# Patient Record
Sex: Female | Born: 1950 | Race: White | Hispanic: No | Smoking: Former smoker
Health system: Southern US, Community
[De-identification: ages and names within clinical notes are randomized; demographics above are authoritative.]

## PROBLEM LIST (undated history)

## (undated) DIAGNOSIS — R011 Cardiac murmur, unspecified: Secondary | ICD-10-CM

## (undated) DIAGNOSIS — I10 Essential (primary) hypertension: Secondary | ICD-10-CM

## (undated) DIAGNOSIS — M1990 Unspecified osteoarthritis, unspecified site: Secondary | ICD-10-CM

## (undated) DIAGNOSIS — C801 Malignant (primary) neoplasm, unspecified: Secondary | ICD-10-CM

## (undated) DIAGNOSIS — I8390 Asymptomatic varicose veins of unspecified lower extremity: Secondary | ICD-10-CM

## (undated) DIAGNOSIS — I499 Cardiac arrhythmia, unspecified: Secondary | ICD-10-CM

## (undated) DIAGNOSIS — N200 Calculus of kidney: Secondary | ICD-10-CM

## (undated) DIAGNOSIS — I2510 Atherosclerotic heart disease of native coronary artery without angina pectoris: Secondary | ICD-10-CM

## (undated) DIAGNOSIS — I739 Peripheral vascular disease, unspecified: Secondary | ICD-10-CM

## (undated) HISTORY — DX: Asymptomatic varicose veins of unspecified lower extremity: I83.90

## (undated) HISTORY — PX: EYE SURGERY: SHX253

## (undated) HISTORY — PX: CHOLECYSTECTOMY: SHX55

## (undated) HISTORY — PX: PELVIC EXENTERATION: SHX738

## (undated) HISTORY — PX: ABDOMINAL HYSTERECTOMY: SHX81

## (undated) HISTORY — PX: CATARACT EXTRACTION, BILATERAL: SHX1313

---

## 2001-03-07 ENCOUNTER — Emergency Department (HOSPITAL_COMMUNITY): Admission: EM | Admit: 2001-03-07 | Discharge: 2001-03-07 | Payer: Self-pay | Admitting: *Deleted

## 2003-05-17 ENCOUNTER — Emergency Department (HOSPITAL_COMMUNITY): Admission: EM | Admit: 2003-05-17 | Discharge: 2003-05-17 | Payer: Self-pay

## 2005-05-20 ENCOUNTER — Other Ambulatory Visit: Admission: RE | Admit: 2005-05-20 | Discharge: 2005-05-20 | Payer: Self-pay | Admitting: Family Medicine

## 2005-09-17 ENCOUNTER — Ambulatory Visit (HOSPITAL_COMMUNITY): Admission: RE | Admit: 2005-09-17 | Discharge: 2005-09-17 | Payer: Self-pay | Admitting: Family Medicine

## 2005-10-02 ENCOUNTER — Encounter: Admission: RE | Admit: 2005-10-02 | Discharge: 2005-10-02 | Payer: Self-pay | Admitting: Family Medicine

## 2005-10-02 ENCOUNTER — Encounter (INDEPENDENT_AMBULATORY_CARE_PROVIDER_SITE_OTHER): Payer: Self-pay | Admitting: Diagnostic Radiology

## 2005-12-26 ENCOUNTER — Emergency Department (HOSPITAL_COMMUNITY): Admission: EM | Admit: 2005-12-26 | Discharge: 2005-12-26 | Payer: Self-pay | Admitting: Emergency Medicine

## 2006-02-11 ENCOUNTER — Other Ambulatory Visit: Admission: RE | Admit: 2006-02-11 | Discharge: 2006-02-11 | Payer: Self-pay | Admitting: Obstetrics and Gynecology

## 2006-04-21 ENCOUNTER — Emergency Department (HOSPITAL_COMMUNITY): Admission: EM | Admit: 2006-04-21 | Discharge: 2006-04-21 | Payer: Self-pay | Admitting: Emergency Medicine

## 2006-04-30 ENCOUNTER — Ambulatory Visit (HOSPITAL_COMMUNITY): Admission: RE | Admit: 2006-04-30 | Discharge: 2006-04-30 | Payer: Self-pay | Admitting: Family Medicine

## 2006-05-28 ENCOUNTER — Ambulatory Visit: Payer: Self-pay | Admitting: Oncology

## 2006-06-08 ENCOUNTER — Ambulatory Visit: Admission: RE | Admit: 2006-06-08 | Discharge: 2006-09-12 | Payer: Self-pay | Admitting: Radiation Oncology

## 2006-06-08 LAB — COMPREHENSIVE METABOLIC PANEL
Albumin: 4.5 g/dL (ref 3.5–5.2)
Alkaline Phosphatase: 95 U/L (ref 39–117)
BUN: 10 mg/dL (ref 6–23)
CO2: 24 mEq/L (ref 19–32)
Calcium: 9.6 mg/dL (ref 8.4–10.5)
Glucose, Bld: 82 mg/dL (ref 70–99)
Potassium: 3.9 mEq/L (ref 3.5–5.3)
Sodium: 140 mEq/L (ref 135–145)
Total Protein: 7.2 g/dL (ref 6.0–8.3)

## 2006-06-08 LAB — CBC WITH DIFFERENTIAL/PLATELET
Basophils Absolute: 0 10*3/uL (ref 0.0–0.1)
Eosinophils Absolute: 0.2 10*3/uL (ref 0.0–0.5)
HGB: 11.4 g/dL — ABNORMAL LOW (ref 11.6–15.9)
MCV: 82.7 fL (ref 81.0–101.0)
MONO#: 0.4 10*3/uL (ref 0.1–0.9)
MONO%: 7.7 % (ref 0.0–13.0)
NEUT#: 3.2 10*3/uL (ref 1.5–6.5)
Platelets: 321 10*3/uL (ref 145–400)
RDW: 14 % (ref 11.3–14.5)
WBC: 5.8 10*3/uL (ref 3.9–10.0)

## 2006-06-09 LAB — URINALYSIS, MICROSCOPIC - CHCC
Bilirubin (Urine): NEGATIVE
Glucose: NEGATIVE g/dL
Ketones: NEGATIVE mg/dL
Leukocyte Esterase: NEGATIVE
RBC count: NEGATIVE (ref 0–2)
WBC, UA: NEGATIVE (ref 0–2)
pH: 7.5 (ref 4.6–8.0)

## 2006-06-15 LAB — URINE CULTURE

## 2006-06-16 LAB — URINALYSIS, MICROSCOPIC - CHCC
Glucose: NEGATIVE g/dL
Leukocyte Esterase: NEGATIVE
Nitrite: NEGATIVE
Protein: NEGATIVE mg/dL
RBC count: NEGATIVE (ref 0–2)
Specific Gravity, Urine: 1.01 (ref 1.003–1.035)
WBC, UA: NEGATIVE (ref 0–2)

## 2006-06-18 LAB — URINE CULTURE

## 2006-06-25 ENCOUNTER — Ambulatory Visit: Payer: Self-pay | Admitting: Vascular Surgery

## 2006-06-25 ENCOUNTER — Encounter: Payer: Self-pay | Admitting: Vascular Surgery

## 2006-06-25 ENCOUNTER — Ambulatory Visit: Admission: RE | Admit: 2006-06-25 | Discharge: 2006-06-25 | Payer: Self-pay | Admitting: Oncology

## 2006-06-25 LAB — CBC WITH DIFFERENTIAL/PLATELET
BASO%: 0.5 % (ref 0.0–2.0)
Basophils Absolute: 0 10*3/uL (ref 0.0–0.1)
EOS%: 1.8 % (ref 0.0–7.0)
HGB: 11.6 g/dL (ref 11.6–15.9)
MCH: 28.1 pg (ref 26.0–34.0)
MCHC: 34 g/dL (ref 32.0–36.0)
MCV: 82.6 fL (ref 81.0–101.0)
MONO%: 6.8 % (ref 0.0–13.0)
RDW: 13 % (ref 11.3–14.5)
lymph#: 1.7 10*3/uL (ref 0.9–3.3)

## 2006-06-25 LAB — BASIC METABOLIC PANEL
BUN: 11 mg/dL (ref 6–23)
Creatinine, Ser: 0.71 mg/dL (ref 0.40–1.20)
Potassium: 4.1 mEq/L (ref 3.5–5.3)

## 2006-06-25 LAB — MAGNESIUM: Magnesium: 1.8 mg/dL (ref 1.5–2.5)

## 2006-06-30 ENCOUNTER — Ambulatory Visit (HOSPITAL_COMMUNITY): Admission: RE | Admit: 2006-06-30 | Discharge: 2006-06-30 | Payer: Self-pay | Admitting: Oncology

## 2006-07-02 LAB — COMPREHENSIVE METABOLIC PANEL
ALT: 17 U/L (ref 0–35)
Albumin: 4.5 g/dL (ref 3.5–5.2)
CO2: 26 mEq/L (ref 19–32)
Calcium: 9.2 mg/dL (ref 8.4–10.5)
Chloride: 104 mEq/L (ref 96–112)
Potassium: 4.2 mEq/L (ref 3.5–5.3)
Sodium: 139 mEq/L (ref 135–145)
Total Protein: 6.8 g/dL (ref 6.0–8.3)

## 2006-07-02 LAB — CBC WITH DIFFERENTIAL/PLATELET
BASO%: 1.2 % (ref 0.0–2.0)
Eosinophils Absolute: 0.2 10*3/uL (ref 0.0–0.5)
MCHC: 34.8 g/dL (ref 32.0–36.0)
MONO#: 0.3 10*3/uL (ref 0.1–0.9)
NEUT#: 2.7 10*3/uL (ref 1.5–6.5)
RBC: 3.95 10*6/uL (ref 3.70–5.32)
WBC: 4.1 10*3/uL (ref 3.9–10.0)
lymph#: 0.8 10*3/uL — ABNORMAL LOW (ref 0.9–3.3)

## 2006-07-02 LAB — MAGNESIUM: Magnesium: 2.1 mg/dL (ref 1.5–2.5)

## 2006-07-06 LAB — CBC WITH DIFFERENTIAL/PLATELET
Eosinophils Absolute: 0.2 10*3/uL (ref 0.0–0.5)
MCV: 81.9 fL (ref 81.0–101.0)
MONO%: 7.4 % (ref 0.0–13.0)
NEUT#: 3.3 10*3/uL (ref 1.5–6.5)
RBC: 4.21 10*6/uL (ref 3.70–5.32)
RDW: 11.3 % (ref 11.3–14.5)
WBC: 4.6 10*3/uL (ref 3.9–10.0)

## 2006-07-06 LAB — MAGNESIUM: Magnesium: 2.4 mg/dL (ref 1.5–2.5)

## 2006-07-06 LAB — BASIC METABOLIC PANEL
CO2: 29 mEq/L (ref 19–32)
Calcium: 9.4 mg/dL (ref 8.4–10.5)
Creatinine, Ser: 0.65 mg/dL (ref 0.40–1.20)

## 2006-07-07 ENCOUNTER — Ambulatory Visit: Payer: Self-pay | Admitting: Oncology

## 2006-07-09 LAB — COMPREHENSIVE METABOLIC PANEL
AST: 15 U/L (ref 0–37)
Albumin: 4.2 g/dL (ref 3.5–5.2)
Alkaline Phosphatase: 49 U/L (ref 39–117)
Glucose, Bld: 63 mg/dL — ABNORMAL LOW (ref 70–99)
Potassium: 3.8 mEq/L (ref 3.5–5.3)
Sodium: 142 mEq/L (ref 135–145)
Total Protein: 6.7 g/dL (ref 6.0–8.3)

## 2006-07-13 LAB — CBC WITH DIFFERENTIAL/PLATELET
BASO%: 1.1 % (ref 0.0–2.0)
LYMPH%: 14.5 % (ref 14.0–48.0)
MCH: 28 pg (ref 26.0–34.0)
MCHC: 34.3 g/dL (ref 32.0–36.0)
MCV: 81.6 fL (ref 81.0–101.0)
MONO%: 7.5 % (ref 0.0–13.0)
Platelets: 261 10*3/uL (ref 145–400)
RBC: 4.13 10*6/uL (ref 3.70–5.32)
WBC: 4.2 10*3/uL (ref 3.9–10.0)

## 2006-07-20 LAB — URINALYSIS, MICROSCOPIC - CHCC
Bilirubin (Urine): NEGATIVE
Glucose: NEGATIVE g/dL
Ketones: NEGATIVE mg/dL
RBC count: NEGATIVE (ref 0–2)
pH: 6 (ref 4.6–8.0)

## 2006-07-20 LAB — CBC WITH DIFFERENTIAL/PLATELET
Basophils Absolute: 0.1 10*3/uL (ref 0.0–0.1)
EOS%: 3.6 % (ref 0.0–7.0)
Eosinophils Absolute: 0.1 10*3/uL (ref 0.0–0.5)
HGB: 11.3 g/dL — ABNORMAL LOW (ref 11.6–15.9)
MCH: 28.2 pg (ref 26.0–34.0)
NEUT#: 2.4 10*3/uL (ref 1.5–6.5)
RDW: 12.6 % (ref 11.3–14.5)
lymph#: 0.5 10*3/uL — ABNORMAL LOW (ref 0.9–3.3)

## 2006-07-20 LAB — BASIC METABOLIC PANEL
Calcium: 9.3 mg/dL (ref 8.4–10.5)
Sodium: 139 mEq/L (ref 135–145)

## 2006-07-20 LAB — MAGNESIUM: Magnesium: 2.3 mg/dL (ref 1.5–2.5)

## 2006-07-21 LAB — URINE CULTURE

## 2006-07-27 LAB — CBC WITH DIFFERENTIAL/PLATELET
Basophils Absolute: 0 10*3/uL (ref 0.0–0.1)
Eosinophils Absolute: 0.1 10*3/uL (ref 0.0–0.5)
HCT: 32.3 % — ABNORMAL LOW (ref 34.8–46.6)
HGB: 11 g/dL — ABNORMAL LOW (ref 11.6–15.9)
MONO#: 0.4 10*3/uL (ref 0.1–0.9)
NEUT%: 76.6 % (ref 39.6–76.8)
Platelets: 231 10*3/uL (ref 145–400)
WBC: 4.2 10*3/uL (ref 3.9–10.0)
lymph#: 0.4 10*3/uL — ABNORMAL LOW (ref 0.9–3.3)

## 2006-08-21 ENCOUNTER — Ambulatory Visit: Payer: Self-pay | Admitting: Oncology

## 2006-08-25 LAB — COMPREHENSIVE METABOLIC PANEL
AST: 18 U/L (ref 0–37)
BUN: 11 mg/dL (ref 6–23)
Calcium: 9.5 mg/dL (ref 8.4–10.5)
Chloride: 105 mEq/L (ref 96–112)
Creatinine, Ser: 0.76 mg/dL (ref 0.40–1.20)

## 2006-08-25 LAB — CBC WITH DIFFERENTIAL/PLATELET
Basophils Absolute: 0 10*3/uL (ref 0.0–0.1)
EOS%: 1.8 % (ref 0.0–7.0)
HCT: 29.6 % — ABNORMAL LOW (ref 34.8–46.6)
HGB: 10.6 g/dL — ABNORMAL LOW (ref 11.6–15.9)
LYMPH%: 16.2 % (ref 14.0–48.0)
MCH: 30.3 pg (ref 26.0–34.0)
MCV: 84.7 fL (ref 81.0–101.0)
MONO%: 8.8 % (ref 0.0–13.0)
NEUT%: 72.5 % (ref 39.6–76.8)
Platelets: 347 10*3/uL (ref 145–400)
lymph#: 0.5 10*3/uL — ABNORMAL LOW (ref 0.9–3.3)

## 2006-12-02 ENCOUNTER — Ambulatory Visit: Payer: Self-pay | Admitting: Oncology

## 2007-11-09 ENCOUNTER — Ambulatory Visit: Payer: Self-pay | Admitting: Vascular Surgery

## 2007-11-09 ENCOUNTER — Inpatient Hospital Stay (HOSPITAL_COMMUNITY): Admission: EM | Admit: 2007-11-09 | Discharge: 2007-11-12 | Payer: Self-pay | Admitting: Emergency Medicine

## 2007-11-09 ENCOUNTER — Encounter (INDEPENDENT_AMBULATORY_CARE_PROVIDER_SITE_OTHER): Payer: Self-pay | Admitting: Emergency Medicine

## 2007-11-13 ENCOUNTER — Emergency Department (HOSPITAL_COMMUNITY): Admission: EM | Admit: 2007-11-13 | Discharge: 2007-11-14 | Payer: Self-pay | Admitting: Emergency Medicine

## 2008-01-16 ENCOUNTER — Emergency Department (HOSPITAL_COMMUNITY): Admission: EM | Admit: 2008-01-16 | Discharge: 2008-01-16 | Payer: Self-pay | Admitting: Emergency Medicine

## 2008-01-17 ENCOUNTER — Emergency Department (HOSPITAL_COMMUNITY): Admission: EM | Admit: 2008-01-17 | Discharge: 2008-01-17 | Payer: Self-pay | Admitting: Emergency Medicine

## 2008-09-04 ENCOUNTER — Emergency Department (HOSPITAL_COMMUNITY): Admission: EM | Admit: 2008-09-04 | Discharge: 2008-09-04 | Payer: Self-pay | Admitting: Emergency Medicine

## 2008-09-06 ENCOUNTER — Ambulatory Visit: Payer: Self-pay | Admitting: Vascular Surgery

## 2008-09-06 ENCOUNTER — Encounter (INDEPENDENT_AMBULATORY_CARE_PROVIDER_SITE_OTHER): Payer: Self-pay | Admitting: Emergency Medicine

## 2008-09-06 ENCOUNTER — Emergency Department (HOSPITAL_COMMUNITY): Admission: EM | Admit: 2008-09-06 | Discharge: 2008-09-06 | Payer: Self-pay | Admitting: Emergency Medicine

## 2009-01-10 ENCOUNTER — Encounter (INDEPENDENT_AMBULATORY_CARE_PROVIDER_SITE_OTHER): Payer: Self-pay | Admitting: Surgery

## 2009-01-10 ENCOUNTER — Ambulatory Visit (HOSPITAL_COMMUNITY): Admission: RE | Admit: 2009-01-10 | Discharge: 2009-01-11 | Payer: Self-pay | Admitting: Surgery

## 2009-09-20 ENCOUNTER — Ambulatory Visit: Payer: Self-pay | Admitting: Vascular Surgery

## 2010-03-11 ENCOUNTER — Encounter
Admission: RE | Admit: 2010-03-11 | Discharge: 2010-03-11 | Payer: Self-pay | Source: Home / Self Care | Attending: Geriatric Medicine | Admitting: Geriatric Medicine

## 2010-04-04 ENCOUNTER — Encounter
Admission: RE | Admit: 2010-04-04 | Discharge: 2010-04-04 | Payer: Self-pay | Source: Home / Self Care | Attending: Geriatric Medicine | Admitting: Geriatric Medicine

## 2010-04-21 ENCOUNTER — Encounter: Payer: Self-pay | Admitting: Family Medicine

## 2010-05-01 ENCOUNTER — Encounter: Payer: Self-pay | Admitting: Geriatric Medicine

## 2010-07-04 LAB — COMPREHENSIVE METABOLIC PANEL
Albumin: 4.3 g/dL (ref 3.5–5.2)
CO2: 30 mEq/L (ref 19–32)
Calcium: 9.9 mg/dL (ref 8.4–10.5)
Chloride: 105 mEq/L (ref 96–112)
GFR calc Af Amer: >60 mL/min (ref >60)
GFR calc non Af Amer: >60 mL/min (ref >60)
Sodium: 143 mEq/L (ref 135–145)
Total Bilirubin: 0.6 mg/dL (ref 0.3–1.2)

## 2010-07-04 LAB — CBC
HCT: 36.8 % (ref 36.0–46.0)
Hemoglobin: 12.4 g/dL (ref 12.0–15.0)
RDW: 13.3 % (ref 11.5–15.5)

## 2010-07-04 LAB — DIFFERENTIAL
Basophils Absolute: 0 10*3/uL (ref 0.0–0.1)
Lymphs Abs: 1.5 10*3/uL (ref 0.7–4.0)
Monocytes Relative: 7 % (ref 3–12)

## 2010-07-08 LAB — POCT I-STAT, CHEM 8
BUN: 12 mg/dL (ref 6–23)
Calcium, Ion: 1.24 mmol/L (ref 1.12–1.32)
Chloride: 101 meq/L (ref 96–112)
Creatinine, Ser: 1.1 mg/dL (ref 0.4–1.2)
Glucose, Bld: 117 mg/dL — ABNORMAL HIGH (ref 70–99)
HCT: 40 % (ref 36.0–46.0)
Hemoglobin: 13.6 g/dL (ref 12.0–15.0)
Potassium: 3.7 mEq/L (ref 3.5–5.1)
Sodium: 138 mEq/L (ref 135–145)
TCO2: 27 mmol/L (ref 0–100)

## 2010-07-08 LAB — URINALYSIS, ROUTINE W REFLEX MICROSCOPIC
Glucose, UA: NEGATIVE mg/dL
Hgb urine dipstick: NEGATIVE
Ketones, ur: NEGATIVE mg/dL
Specific Gravity, Urine: 1.004 — ABNORMAL LOW (ref 1.005–1.030)
Urobilinogen, UA: 0.2 mg/dL (ref 0.0–1.0)
pH: 6 (ref 5.0–8.0)

## 2010-07-08 LAB — CBC
HCT: 37.2 % (ref 36.0–46.0)
RBC: 4.25 MIL/uL (ref 3.87–5.11)
WBC: 16.4 10*3/uL — ABNORMAL HIGH (ref 4.0–10.5)

## 2010-07-08 LAB — DIFFERENTIAL
Eosinophils Absolute: 0 10*3/uL (ref 0.0–0.7)
Eosinophils Relative: 0 % (ref 0–5)
Lymphocytes Relative: 2 % — ABNORMAL LOW (ref 12–46)
Lymphs Abs: 0.4 10*3/uL — ABNORMAL LOW (ref 0.7–4.0)
Neutro Abs: 14.6 10*3/uL — ABNORMAL HIGH (ref 1.7–7.7)

## 2010-07-08 LAB — LIPASE, BLOOD: Lipase: 19 U/L (ref 11–59)

## 2010-07-08 LAB — COMPREHENSIVE METABOLIC PANEL
AST: 26 U/L (ref 0–37)
Alkaline Phosphatase: 62 U/L (ref 39–117)
BUN: 11 mg/dL (ref 6–23)
GFR calc Af Amer: >60 mL/min (ref >60)
GFR calc non Af Amer: >60 mL/min (ref >60)
Total Bilirubin: 0.8 mg/dL (ref 0.3–1.2)

## 2010-07-08 LAB — URINE CULTURE: Colony Count: 1000

## 2010-07-08 LAB — URINE MICROSCOPIC-ADD ON

## 2010-08-13 NOTE — Consult Note (Signed)
NEW PATIENT CONSULTATION   Stallone, Ebony A  DOB:  [birth year]                                       09/20/2009  OVFIE#:[id number]   I saw the patient in the office today in consultation concerning her  bilateral leg pain.  This is a pleasant 60 year old woman who began  having pain in both lower extremities in 2008.  The pain involves her  hips all the way down to her feet bilaterally.  The pain began in 2008  and has been gradually getting worse.  She had exploratory laparotomy  for uterine cancer in 2008 with TAH-BSO and she feels that the swelling  began after this.  She has apparently had some problems with cellulitis  in her legs more significantly on the left side.  She has had swelling  in both lower extremities but more significantly on the left side.  I do  not get any clear-cut history of claudication, rest pain or nonhealing  ulcers.  She is unaware of any previous history of DVT or phlebitis.  Her symptoms are aggravated by standing and walking and are alleviated  somewhat with elevation.  The swelling is alleviated with elevation.   Her past medical history is significant for hypertension and  hypercholesterolemia.  She denies any history of diabetes, history of  previous myocardial infarction, history of congestive heart failure or  history of COPD.   FAMILY HISTORY:  Her mother had an irregular heart rhythm.  She is  unaware of any history of premature cardiovascular disease.   SOCIAL HISTORY:  She is widowed.  She has one child.  She does not smoke  cigarettes.   REVIEW OF SYSTEMS:  GENERAL:  She has had some weight gain.  She has had  no change in her appetite.  No fever.  CARDIOVASCULAR:  She has had no chest pain, chest pressure, palpitations  or arrhythmias.  She does admit to dyspnea on exertion.  She has had no  orthopnea.  She has had no history of stroke, TIAs or amaurosis fugax.  GI:  She has had a history of reflux and a hiatal  hernia.  MUSCULOSKELETAL:  She does have a history of arthritis and muscle pain.  ENT:  She has had some change in her eyesight recently.  Pulmonary, GU, neurologic, psychiatric, hematologic review of systems is  unremarkable and is documented on the medical history form in her chart.   PHYSICAL EXAMINATION:  General:  This is a pleasant 60 year old woman  who appears her stated age.  Vital signs:  Blood pressure is 125/78,  heart rate is 62, respiratory rate 12.  HEENT:  Unremarkable.  Lungs:  Are clear bilaterally to auscultation without rales, rhonchi or  wheezing.  Cardiovascular:  I do not detect any carotid bruits.  She has  a regular rate and rhythm.  She has palpable femoral, popliteal and  pedal pulses bilaterally.  She has varicose veins and telangiectasias  bilaterally.  Abdomen:  Soft and nontender with normal pitched bowel  sounds.  No masses are appreciated.  Musculoskeletal:  She has no major  deformities or cyanosis.  Neurological:  She has no focal weakness or  paresthesias.  She does have some mild bilateral lower extremity  swelling.  She has no significant hyperpigmentation.   I did order and independently interpreted her venous duplex scan  which  shows no evidence of DVT and no evidence of significant deep venous  insufficiency.   I think most likely the patient has some lymphedema bilaterally related  to her previous pelvic surgery.  This is more common on the left side.  We have discussed the importance of intermittent leg elevation and the  proper positioning for this.  I have also written her a prescription for  a knee high compression stocking with a mild gradient for her to try and  I think this would also help with her symptoms.  I have reassured her  that she has no evidence of significant arterial insufficiency.  I would  be happy to see her back at any time if any new vascular issues arise.     Di Kindle. Edilia Bo, M.D.  Electronically Signed    CSD/MEDQ  D:  09/20/2009  T:  09/21/2009  Job:  [id number]   cc:   Jodelle Red. Hatchett, N.P.

## 2010-08-13 NOTE — Discharge Summary (Signed)
NAMEJUDAH, CHEVERE NO.:  [id number]   MEDICAL RECORD NO.:  [id number]         PATIENT TYPE:  CINP   LOCATION:                               FACILITY:  [facility]   PHYSICIAN:  Eduard Clos, MDDATE OF BIRTH:  02/23/1951   DATE OF ADMISSION:  DATE OF DISCHARGE:                               DISCHARGE SUMMARY   HOSPITAL COURSE:  A 60 year old female with known history of CA of the  uterus, status post hysterectomy, with history of generalized anxiety  disorder presented with left lower extremity edema and swelling.  On  admission, the patient had Doppler which was negative for DVT.  The  patient was started on empiric antibiotics for cellulitis.  Blood  cultures obtained at admission were negative for any growth at time of  discharge.  The patient also had some reactive lymphadenopathy in the  left groin, for which I have advised the patient to follow up with her  primary care physician to make sure it has resolved with the antibiotic  course; however, it will need further workup.  The patient's condition  gradually improved.  At this time, the patient's swelling is reduced and  the patient is ambulatory and is eager to go home.   FINAL DIAGNOSES:  1. Cellulitis of the left lower extremity.  2. History of cancer of uterus, status post hysterectomy.  3. Generalized anxiety disorder.   DISCHARGE MEDICATIONS:  1. Bactrim DS 1 tablet p.o. b.i.d. for 14 days.  2. Lyrica 50 mg p.o. t.i.d.  3. Clonazepam 0.5 mg tablet to take half to one tablet p.o. b.i.d.  4. Omeprazole 20 mg p.o. daily.  5. Furosemide 20 mg p.o. daily.  6. Klor-Con p.o. daily.  7. Hydrocodone 5/500 p.o. b.i.d. p.r.n.  8. Centrum Silver p.o. daily.  9. Iron 65 mg p.o. daily.  10.Calcium citrate with vitamin D p.o. daily.   PLAN:  The patient advised to follow up with her primary care physician  within a week's time and recheck her basic metabolic panel.  The patient  advised to complete her  course of antibiotics and make sure the reactive  lymphadenopathy in the left groin resolved while she follows with her  primary care physician.      Eduard Clos, MD  Electronically Signed     ANK/MEDQ  D:  11/12/2007  T:  11/13/2007  Job:  [id number]

## 2010-08-13 NOTE — Procedures (Signed)
DUPLEX DEEP VENOUS EXAM - LOWER EXTREMITY   INDICATION:  Pain and swelling in the left lower extremity.   HISTORY:  Edema:  Yes.  Trauma/Surgery:  No.  Pain:  Yes.  PE:  No.  Previous DVT:  No.  Anticoagulants:  No.  Other:   DUPLEX EXAM:                CFV   SFV   PopV  PTV    GSV                R  L  R  L  R  L  R   L  R  L  Thrombosis    0  0     0     0      0     0  Spontaneous   +  +     +     +      +     +  Phasic        +  +     +     +      +     +  Augmentation  +  +     +     +      +     +  Compressible  +  +     +     +      +     +  Competent     +  +     +     +      +     +   Legend:  + - yes  o - no  p - partial  D - decreased   IMPRESSION:  The left leg appears to be negative for deep venous  thrombosis or phlebitis.    _____________________________  Di Kindle. Edilia Bo, M.D.   NT/MEDQ  D:  09/20/2009  T:  09/20/2009  Job:  [id number]

## 2010-08-13 NOTE — H&P (Signed)
NAMEFLORENTINE, DIEKMAN                ACCOUNT NO.:  [id number]   MEDICAL RECORD NO.:  [id number]          PATIENT TYPE:  INP   LOCATION:  1826                         FACILITY:  [facility]   PHYSICIAN:  Darryl D. Prime, MD    DATE OF BIRTH:  September 05, 1950   DATE OF ADMISSION:  11/09/2007  DATE OF DISCHARGE:                              HISTORY & PHYSICAL   CODE:  Full Code.   PRIMARY CARE PHYSICIAN:  Dr. Redmond School.   SURGICAL ONCOLOGIST:  Wendy Poet, MD at [facility] in  [facility].   CHIEF COMPLAINT:  Fever and leg pain.   TOTAL VISIT TIME:  Approximately 66 minutes.   HISTORY OF PRESENT ILLNESS:  Ms. Poma is a 60 year old female with a  history of cervical uterine cancer, status post hysterectomy and  oophorectomy in January of 2008, who presents with left lower extremity  swelling, redness and warmth for the last 6 days and also subjective  fevers.  The patient notes lower extremity edema on the left,  longstanding even before her surgery, but notes it has not worsened, but  the redness and pain and warmth in the area of the lower leg is new.  The patient notes fever 5 days ago, and also on Monday which was 1 day  prior to admission.  On Monday, the day prior to admission, she saw her  surgical oncologist, Wendy Poet at [facility] and had a CT scan  showing that she notes possible reactive lymphadenopathy in the groin  area and possible cellulitis and was prescribed Keflex, which she  started on yesterday.  The patient notes having a fever on that day that  was measured.  The patient notes the swelling persisted and presented to  emergency room.  In the emergency room she was given Unasyn and  vancomycin.  She was also given Ketorolac IV.  Blood cultures were  ordered.  She had an ultrasound to rule out DVT on that side, which was  negative per ER physician.   PAST MEDICAL AND PAST SURGICAL HISTORY:  As above, but she denies any  other  medical problems.  She has anxiety, not otherwise specified.  She  takes calcium.  She also takes Lasix for possible chronic lower  extremity edema and she also takes potassium supplements.  She also have  possibly gastroesophageal reflux disease, as she is on omeprazole.   MEDICATIONS:  She is on Klonopin 0.5 mg twice a day, Caltrate 600 with D  with a daily over-the-counter multivitamin daily, Avapro 800 mg p.r.n.  Lasix 20 mg daily, Klor-Con 20 daily, iron 65 mg daily.  She is on  calcium citrate plus D daily.  She is on hydrocodone/acetaminophen 2  tabs a day as needed.   ALLERGIES:  NO KNOWN DRUG ALLERGIES   SOCIAL HISTORY:  She does not smoke.  Denies any illicit drug use.  She  drinks beer rarely.  She lives by herself.  She is widowed.   FAMILY HISTORY:  Mother had cancer of the throat.   REVIEW OF SYSTEMS:  A 14-point review  of systems is negative, unless  stated above.   PHYSICAL EXAMINATION:  VITAL SIGNS:  Temperature is 97.9 with a blood  pressure of 135/77.  Respiratory rate of 18.  Pulse is 79.  Sat is 100%  on room air.  In general, she is a female who looks younger than her  stated age, sitting upright in no acute distress.  HEENT:  Normocephalic, atraumatic.  Pupils equal, round, reactive to  light.  Extraocular movements are intact.  The oropharynx is dry.  NECK:  Supple, with no lymphadenopathy or thyromegaly.  The oropharynx shows no  posterior pharyngeal lesions.  CARDIOVASCULAR:  Regular rhythm and rate with no murmurs, rubs or  gallops.  Normal S1 and S2.  No S3 or S4.  No carotid bruits.  No  jugular venous distention.  LUNGS:  Clear to auscultation bilaterally.  EXTREMITIES:  Show no clubbing, cyanosis.  She has 2+ lower extremity  edema on the left and 1+ on the right.  She has significant redness and  mild warmth in the lower extremity, on the left in the area of the lower  leg and over the ankle.  The patient not tender when the area is  palpated.   NEUROLOGIC:  She is alert and oriented x4 with cranial nerves 2-12  grossly intact.  ABDOMEN:  Soft, nontender, nondistended, obese.  She has shotty  lymphadenopathy in the groin bilaterally.  LUNGS:  Clear to auscultation bilaterally.   LABORATORY DATA:  Sed rate was 67.  She had a white urinalysis that  showed WBCs 11-20, RBCs 0-2, bacteria rare.  Urinalysis was significant  for 15 mg/dL of ketones, small leukocytes.  The patient's PT was 14.1  with an INR of 1.1.  PTT was 35.  The patient's complete metabolic panel  showed a sodium of 137, potassium of 3.7, chloride 102, CO2 27, glucose  94, BUN 7, creatinine 0.66, total bilirubin 0.6, otherwise LFTs were  normal.  CBC showed a white count of 9.6 and a hemoglobin of 11,  hematocrit 32.2, platelets of 227 with segs of 78%, lymphocytes 13%.  The patient's ultrasound, rule out DVTs as above.  Chest x-ray showed  mild cardiomegaly, otherwise negative.  The tib-fib x-ray on the left  shows soft tissue swelling.   ASSESSMENT AND PLAN:  This is a patient with a history of gynecological  cancer, who now presents with cellulitis.  She will be admitted to the  med-surg unit to be followed for blood cultures and follow up final  report on rule out deep venous thrombosis.  She will be continued on  vancomycin and Zosyn that was started in the emergency room and will try  to get old records regarding her computerized tomography scans and rule  out possible current malignancy.  Deep venous thrombosis prophylaxis  will be with heparin.  Gastrointestinal prophylaxis will be she will be  continued on her omeprazole.  Will hold her Lasix as her ketones are  elevated.  We will elevate her leg and mark her leg.      Darryl D. Prime, MD  Electronically Signed     DDP/MEDQ  D:  11/09/2007  T:  11/10/2007  Job:  [id number]

## 2010-12-27 LAB — POCT I-STAT, CHEM 8
Creatinine, Ser: 0.8
HCT: 33 — ABNORMAL LOW
Hemoglobin: 11.2 — ABNORMAL LOW
Potassium: 3.9
Sodium: 139
TCO2: 29

## 2010-12-27 LAB — URINE CULTURE: Culture: NO GROWTH

## 2010-12-27 LAB — URINALYSIS, ROUTINE W REFLEX MICROSCOPIC
Protein, ur: NEGATIVE
Specific Gravity, Urine: 1.008
Urobilinogen, UA: 1

## 2010-12-27 LAB — DIFFERENTIAL
Basophils Absolute: 0.1
Basophils Relative: 1
Eosinophils Absolute: 0.1
Eosinophils Relative: 1
Lymphs Abs: 1.2
Neutrophils Relative %: 78 — ABNORMAL HIGH

## 2010-12-27 LAB — CULTURE, BLOOD (ROUTINE X 2): Culture: NO GROWTH

## 2010-12-27 LAB — COMPREHENSIVE METABOLIC PANEL
Albumin: 3.6
BUN: 7
Calcium: 9.2
Chloride: 102
Creatinine, Ser: 0.66
GFR calc Af Amer: >60
GFR calc non Af Amer: >60
Total Bilirubin: 0.6

## 2010-12-27 LAB — CBC
HCT: 32.2 — ABNORMAL LOW
Hemoglobin: 11 — ABNORMAL LOW
RDW: 13.5

## 2010-12-27 LAB — URINE MICROSCOPIC-ADD ON

## 2010-12-27 LAB — PROTIME-INR
INR: 1.1
Prothrombin Time: 14.1

## 2010-12-27 LAB — GLUCOSE, CAPILLARY

## 2010-12-27 LAB — APTT: aPTT: 35

## 2010-12-30 LAB — URINALYSIS, ROUTINE W REFLEX MICROSCOPIC
Bilirubin Urine: NEGATIVE
Nitrite: NEGATIVE
Specific Gravity, Urine: 1.004 — ABNORMAL LOW
Urobilinogen, UA: 0.2

## 2010-12-30 LAB — COMPREHENSIVE METABOLIC PANEL
ALT: 16
AST: 22
CO2: 29
Calcium: 9.9
GFR calc Af Amer: >60
GFR calc non Af Amer: >60
Sodium: 141

## 2010-12-30 LAB — CBC
MCHC: 33
RBC: 4.21
WBC: 6.6

## 2010-12-30 LAB — DIFFERENTIAL
Eosinophils Absolute: 0.1
Eosinophils Relative: 2
Lymphs Abs: 1.1
Monocytes Relative: 6

## 2010-12-31 LAB — URINALYSIS, ROUTINE W REFLEX MICROSCOPIC
Glucose, UA: NEGATIVE
Hgb urine dipstick: NEGATIVE
Ketones, ur: NEGATIVE
Protein, ur: NEGATIVE

## 2011-06-09 ENCOUNTER — Emergency Department (HOSPITAL_COMMUNITY)
Admission: EM | Admit: 2011-06-09 | Discharge: 2011-06-10 | Disposition: A | Discharge status: Home Health | Payer: Medicare Other | Attending: Emergency Medicine | Admitting: Emergency Medicine

## 2011-06-09 ENCOUNTER — Other Ambulatory Visit: Payer: Self-pay

## 2011-06-09 ENCOUNTER — Encounter (HOSPITAL_COMMUNITY): Payer: Self-pay | Admitting: *Deleted

## 2011-06-09 DIAGNOSIS — R110 Nausea: Secondary | ICD-10-CM | POA: Insufficient documentation

## 2011-06-09 DIAGNOSIS — R079 Chest pain, unspecified: Secondary | ICD-10-CM | POA: Insufficient documentation

## 2011-06-09 DIAGNOSIS — I10 Essential (primary) hypertension: Secondary | ICD-10-CM | POA: Insufficient documentation

## 2011-06-09 DIAGNOSIS — E876 Hypokalemia: Secondary | ICD-10-CM | POA: Insufficient documentation

## 2011-06-09 DIAGNOSIS — I2510 Atherosclerotic heart disease of native coronary artery without angina pectoris: Secondary | ICD-10-CM | POA: Insufficient documentation

## 2011-06-09 HISTORY — DX: Essential (primary) hypertension: I10

## 2011-06-09 HISTORY — DX: Atherosclerotic heart disease of native coronary artery without angina pectoris: I25.10

## 2011-06-09 LAB — CBC
HCT: 33.1 % — ABNORMAL LOW (ref 36.0–46.0)
MCH: 30.6 pg (ref 26.0–34.0)
MCHC: 35.3 g/dL (ref 30.0–36.0)
MCV: 86.6 fL (ref 78.0–100.0)
RDW: 12.3 % (ref 11.5–15.5)

## 2011-06-09 LAB — DIFFERENTIAL
Basophils Absolute: 0 10*3/uL (ref 0.0–0.1)
Basophils Relative: 0 % (ref 0–1)
Eosinophils Relative: 5 % (ref 0–5)
Monocytes Absolute: 0.6 10*3/uL (ref 0.1–1.0)

## 2011-06-09 LAB — POCT I-STAT TROPONIN I: Troponin i, poc: 0 ng/mL (ref 0.00–0.08)

## 2011-06-09 LAB — COMPREHENSIVE METABOLIC PANEL
AST: 21 U/L (ref 0–37)
Albumin: 4.3 g/dL (ref 3.5–5.2)
Calcium: 9.3 mg/dL (ref 8.4–10.5)
Creatinine, Ser: 0.55 mg/dL (ref 0.50–1.10)
GFR calc non Af Amer: >90 mL/min (ref >90)

## 2011-06-09 NOTE — ED Notes (Signed)
The pt has had lt upper chest pain since yesterday with some rt arm numbness and some rt toe numbness

## 2011-06-10 ENCOUNTER — Emergency Department (HOSPITAL_COMMUNITY): Payer: Medicare Other

## 2011-06-10 LAB — BASIC METABOLIC PANEL
Chloride: 94 mEq/L — ABNORMAL LOW (ref 96–112)
Creatinine, Ser: 0.59 mg/dL (ref 0.50–1.10)
GFR calc Af Amer: >90 mL/min (ref >90)
GFR calc non Af Amer: >90 mL/min (ref >90)
Potassium: 3.5 mEq/L (ref 3.5–5.1)

## 2011-06-10 LAB — CK TOTAL AND CKMB (NOT AT ARMC): Relative Index: 2.8 — ABNORMAL HIGH (ref 0.0–2.5)

## 2011-06-10 MED ORDER — SODIUM CHLORIDE 0.9 % IV BOLUS (SEPSIS)
500.0000 mL | Freq: Once | INTRAVENOUS | Status: AC
  Administered 2011-06-10: 500 mL via INTRAVENOUS

## 2011-06-10 MED ORDER — POTASSIUM CHLORIDE CRYS ER 20 MEQ PO TBCR
60.0000 meq | EXTENDED_RELEASE_TABLET | Freq: Once | ORAL | Status: AC
  Administered 2011-06-10: 60 meq via ORAL
  Filled 2011-06-10: qty 3

## 2011-06-10 MED ORDER — LORAZEPAM 1 MG PO TABS
2.0000 mg | ORAL_TABLET | Freq: Once | ORAL | Status: AC
  Administered 2011-06-10: 2 mg via ORAL
  Filled 2011-06-10: qty 2

## 2011-06-10 NOTE — Discharge Instructions (Signed)

## 2011-06-10 NOTE — ED Notes (Signed)
The patient is AOx4 and comfortable with her discharge instructions. 

## 2011-06-10 NOTE — ED Provider Notes (Signed)
History     CSN: [id number]  Arrival date & time 06/09/11  2253   First MD Initiated Contact with Patient 06/09/11 2352      Chief Complaint  Patient presents with  . Chest Pain    (Consider location/radiation/quality/duration/timing/severity/associated sxs/prior treatment) Patient is a 61 y.o. female presenting with chest pain. The history is provided by the patient.  Chest Pain    patient presents with left lower chest pain beneath her left breast. Symptoms began this afternoon after taking her Klonopin oral dissolving tablet which is a new medication for her. Since then, her symptoms have been intermittent lasting between 10 and 15 minutes. They have been nonexertional and not associated with dyspnea, diaphoresis. Some nausea has been noted. Patient states that she does for more anxious. Denies any fever or diarrhea.  Past Medical History  Diagnosis Date  . Hypertension   . Coronary artery disease     Past Surgical History  Procedure Date  . Pelvic exenteration     No family history on file.  History  Substance Use Topics  . Smoking status: Never Smoker   . Smokeless tobacco: Not on file  . Alcohol Use: No    OB History    Grav Para Term Preterm Abortions TAB SAB Ect Mult Living                  Review of Systems  Cardiovascular: Positive for chest pain.  All other systems reviewed and are negative.    Allergies  Clonazepam  Home Medications   Current Outpatient Rx  Name Route Sig Dispense Refill  . VITAMIN D3 1000 UNITS PO TABS Oral Take 1,000 Units by mouth daily.    Marland Kitchen FERROUS SULFATE 325 (65 FE) MG PO TABS Oral Take 325 mg by mouth daily.    Marland Kitchen HYDROCHLOROTHIAZIDE 50 MG PO TABS Oral Take 50 mg by mouth daily.    Marland Kitchen LOSARTAN POTASSIUM 50 MG PO TABS Oral Take 50 mg by mouth daily.    . ADULT MULTIVITAMIN W/MINERALS CH Oral Take 1 tablet by mouth daily.    Marland Kitchen OMEPRAZOLE 20 MG PO CPDR Oral Take 20 mg by mouth daily.    Marland Kitchen POTASSIUM CHLORIDE CRYS ER 20  MEQ PO TBCR Oral Take 20 mEq by mouth every morning.    Marland Kitchen PREGABALIN 50 MG PO CAPS Oral Take 50 mg by mouth 3 (three) times daily.      BP 136/71  Pulse 63  Temp(Src) 97.3 F (36.3 C) (Oral)  Resp 16  SpO2 100%  Physical Exam  Nursing note and vitals reviewed. Constitutional: She is oriented to person, place, and time. She appears well-developed and well-nourished.  Non-toxic appearance. No distress.  HENT:  Head: Normocephalic and atraumatic.  Eyes: Conjunctivae, EOM and lids are normal. Pupils are equal, round, and reactive to light.  Neck: Normal range of motion. Neck supple. No tracheal deviation present. No mass present.  Cardiovascular: Normal rate, regular rhythm and normal heart sounds.  Exam reveals no gallop.   No murmur heard. Pulmonary/Chest: Effort normal and breath sounds normal. No stridor. No respiratory distress. She has no decreased breath sounds. She has no wheezes. She has no rhonchi. She has no rales.  Abdominal: Soft. Normal appearance and bowel sounds are normal. She exhibits no distension. There is no tenderness. There is no rebound and no CVA tenderness.  Musculoskeletal: Normal range of motion. She exhibits no edema and no tenderness.  Neurological: She is alert and oriented to  person, place, and time. She has normal strength. No cranial nerve deficit or sensory deficit. GCS eye subscore is 4. GCS verbal subscore is 5. GCS motor subscore is 6.  Skin: Skin is warm and dry. No abrasion and no rash noted.  Psychiatric: She has a normal mood and affect. Her speech is normal and behavior is normal.    ED Course  Procedures (including critical care time)  Labs Reviewed  CBC - Abnormal; Notable for the following:    RBC 3.82 (*)    Hemoglobin 11.7 (*)    HCT 33.1 (*)    All other components within normal limits  COMPREHENSIVE METABOLIC PANEL - Abnormal; Notable for the following:    Sodium 126 (*)    Potassium 3.0 (*)    Chloride 90 (*)    All other  components within normal limits  CK TOTAL AND CKMB - Abnormal; Notable for the following:    Relative Index 2.8 (*)    All other components within normal limits  DIFFERENTIAL  POCT I-STAT TROPONIN I   No results found.   No diagnosis found.    MDM   Date: 06/10/2011  Rate: 68  Rhythm: normal sinus rhythm  QRS Axis: normal  Intervals: normal  ST/T Wave abnormalities: normal  Conduction Disutrbances:none  Narrative Interpretation:   Old EKG Reviewed: none  4:06 AM Suspect the patient had a medication reaction as cause for symptoms. Patient also had hyperkalemia likely due to her use of diuretics. She also had mild hyponatremia. Both of these were treated with saline as well as with potassium by mouth. Patient did appear to be very anxious here was given medication for this and now feels much better. No concern for ACS at this time. Patient to be discharged        Toy Baker, MD 06/10/11 [phone]

## 2011-08-01 ENCOUNTER — Emergency Department (HOSPITAL_COMMUNITY)
Admission: EM | Admit: 2011-08-01 | Discharge: 2011-08-01 | Disposition: A | Discharge status: Home / Self Care | Payer: Medicare Other | Attending: Emergency Medicine | Admitting: Emergency Medicine

## 2011-08-01 ENCOUNTER — Encounter (HOSPITAL_COMMUNITY): Payer: Self-pay | Admitting: Emergency Medicine

## 2011-08-01 DIAGNOSIS — I2510 Atherosclerotic heart disease of native coronary artery without angina pectoris: Secondary | ICD-10-CM | POA: Insufficient documentation

## 2011-08-01 DIAGNOSIS — I10 Essential (primary) hypertension: Secondary | ICD-10-CM | POA: Insufficient documentation

## 2011-08-01 DIAGNOSIS — M129 Arthropathy, unspecified: Secondary | ICD-10-CM | POA: Insufficient documentation

## 2011-08-01 DIAGNOSIS — R300 Dysuria: Secondary | ICD-10-CM

## 2011-08-01 HISTORY — DX: Unspecified osteoarthritis, unspecified site: M19.90

## 2011-08-01 LAB — URINALYSIS, ROUTINE W REFLEX MICROSCOPIC
Ketones, ur: NEGATIVE mg/dL
Leukocytes, UA: NEGATIVE
Nitrite: NEGATIVE
Urobilinogen, UA: 0.2 mg/dL (ref 0.0–1.0)
pH: 7 (ref 5.0–8.0)

## 2011-08-01 MED ORDER — PHENAZOPYRIDINE HCL 95 MG PO TABS: 95.0000 mg | ORAL_TABLET | Freq: Three times a day (TID) | ORAL | Status: AC | PRN

## 2011-08-01 NOTE — ED Provider Notes (Signed)
History   This chart was scribed for Nelia Shi, MD by Brooks Sailors. The patient was seen in room STRE2/STRE2. Patient's care was started at 1429.   CSN: [id number]  Arrival date & time 08/01/11  1429   First MD Initiated Contact with Patient 08/01/11 1633      Chief Complaint  Patient presents with  . Urinary Tract Infection    HPI  Haley Long is a 61 y.o. female who presents to the Emergency Department complaining of dysuria onset yesterday evening and persistent since with associated lower back pain. Patient describes the pain as burning and stinging and that she has had similar pain before. Patient notes recent pelvic exenteration and chemotherapy. Patient history of kidney stones with last stone being over 6 months ago. Patient denies hematuria and fever.    Past Medical History  Diagnosis Date  . Hypertension   . Coronary artery disease   . Arthritis     Past Surgical History  Procedure Date  . Pelvic exenteration     History reviewed. No pertinent family history.  History  Substance Use Topics  . Smoking status: Never Smoker   . Smokeless tobacco: Not on file  . Alcohol Use: No    OB History    Grav Para Term Preterm Abortions TAB SAB Ect Mult Living                  Review of Systems  Genitourinary: Positive for dysuria.  All other systems reviewed and are negative.    Allergies  Clonazepam  Home Medications   Current Outpatient Rx  Name Route Sig Dispense Refill  . VITAMIN D3 1000 UNITS PO TABS Oral Take 1,000 Units by mouth daily.    Marland Kitchen CLONAZEPAM 0.5 MG PO TABS Oral Take 0.25 mg by mouth 2 (two) times daily.    Marland Kitchen FERROUS SULFATE 325 (65 FE) MG PO TABS Oral Take 325 mg by mouth daily.    Marland Kitchen HYDROCHLOROTHIAZIDE 50 MG PO TABS Oral Take 50 mg by mouth daily.    Marland Kitchen HYDROCODONE-ACETAMINOPHEN 5-500 MG PO TABS Oral Take 1 tablet by mouth every 4 (four) hours as needed. For pain    . LOSARTAN POTASSIUM 50 MG PO TABS Oral Take 50 mg by mouth  daily.    . ADULT MULTIVITAMIN W/MINERALS CH Oral Take 1 tablet by mouth daily.    Marland Kitchen OMEPRAZOLE 20 MG PO CPDR Oral Take 20 mg by mouth daily.    Marland Kitchen POTASSIUM CHLORIDE CRYS ER 20 MEQ PO TBCR Oral Take 20 mEq by mouth every morning.    Marland Kitchen PREGABALIN 50 MG PO CAPS Oral Take 50 mg by mouth 3 (three) times daily.    Marland Kitchen PHENAZOPYRIDINE HCL 95 MG PO TABS Oral Take 1 tablet (95 mg total) by mouth 3 (three) times daily as needed for pain. 20 tablet 0    BP 144/60  Pulse 66  Temp(Src) 97.7 F (36.5 C) (Oral)  Resp 20  SpO2 98%  Physical Exam  Nursing note and vitals reviewed. Constitutional: She is oriented to person, place, and time. She appears well-developed and well-nourished. No distress.  HENT:  Head: Normocephalic and atraumatic.  Eyes: EOM are normal.  Neck: Neck supple. No tracheal deviation present.  Cardiovascular: Normal rate.   Pulmonary/Chest: Effort normal. No respiratory distress.  Abdominal: There is no tenderness. There is no CVA tenderness.  Musculoskeletal: Normal range of motion.  Neurological: She is alert and oriented to person, place, and time.  Skin: Skin is warm and dry.  Psychiatric: She has a normal mood and affect. Her behavior is normal.    ED Course  Procedures (including critical care time) DIAGNOSTIC STUDIES: Oxygen Saturation is 96% on room air, normal by my interpretation.    COORDINATION OF CARE: 4:38 PM Patient informed of current plan for treatment and evaluation and agrees with plan at this time.     Labs Reviewed  URINALYSIS, ROUTINE W REFLEX MICROSCOPIC - Abnormal; Notable for the following:    Specific Gravity, Urine 1.003 (*)    All other components within normal limits  LAB REPORT - SCANNED   Results for orders placed during the hospital encounter of 08/01/11  URINALYSIS, ROUTINE W REFLEX MICROSCOPIC      Component Value Range   Color, Urine YELLOW  YELLOW    APPearance CLEAR  CLEAR    Specific Gravity, Urine 1.003 (*) 1.005 - 1.030      pH 7.0  5.0 - 8.0    Glucose, UA NEGATIVE  NEGATIVE (mg/dL)   Hgb urine dipstick NEGATIVE  NEGATIVE    Bilirubin Urine NEGATIVE  NEGATIVE    Ketones, ur NEGATIVE  NEGATIVE (mg/dL)   Protein, ur NEGATIVE  NEGATIVE (mg/dL)   Urobilinogen, UA 0.2  0.0 - 1.0 (mg/dL)   Nitrite NEGATIVE  NEGATIVE    Leukocytes, UA NEGATIVE  NEGATIVE     No results found.   1. Dysuria       I personally performed the services described in this documentation, which was scribed in my presence. The recorded information has been reviewed and considered.      Nelia Shi, MD 08/03/11 1120

## 2011-08-01 NOTE — Discharge Instructions (Signed)
Dysuria You have dysuria. This is pain on urination. Dysuria is often present with other symptoms such as:  A sudden urge to go.   Having to go more often.  Dysuria can be caused by:  Urinary tract infections.   Yeast infections.   Prostate problems.   Urinary stones.   Sexually transmitted diseases.  Lab tests of the urine will usually be needed to confirm a urinary infection. An infection is the cause of dysuria in over half the cases. In older men the prostate gland enlarges and can cause urinary problems. These include:   Urinary obstruction.   Infection.   Pain on urination.  Bladder cancer can also cause blood in the urine and dysuria. If you have an infection, be sure to take the antibiotics prescribed for you until they are gone. This will help prevent a recurrence. Further checking by a specialist may be needed if the cause of your dysuria is not found. Cystoscopy, x-rays, pelvic exams, and special cultures may be needed to find the cause and help find the best treatment. See your caregiver right away if your symptoms are not improved after three days.  SEEK IMMEDIATE MEDICAL CARE IF:  You have difficulty urinating, pass bloody urine, or have chills or a fever. Document Released: 03/17/2005 Document Revised: 03/06/2011 Document Reviewed: 09/01/2006 [employer] Patient Information 2012 [employer]. 

## 2011-08-01 NOTE — ED Notes (Signed)
Pt c/o burning with urination and pain in right flank starting today

## 2011-10-11 ENCOUNTER — Emergency Department (HOSPITAL_COMMUNITY)
Admission: EM | Admit: 2011-10-11 | Discharge: 2011-10-11 | Disposition: A | Discharge status: Home / Self Care | Payer: Medicare Other | Attending: Emergency Medicine | Admitting: Emergency Medicine

## 2011-10-11 ENCOUNTER — Encounter (HOSPITAL_COMMUNITY): Payer: Self-pay | Admitting: *Deleted

## 2011-10-11 DIAGNOSIS — J069 Acute upper respiratory infection, unspecified: Secondary | ICD-10-CM | POA: Insufficient documentation

## 2011-10-11 DIAGNOSIS — M79609 Pain in unspecified limb: Secondary | ICD-10-CM | POA: Insufficient documentation

## 2011-10-11 MED ORDER — PSEUDOEPHEDRINE-GUAIFENESIN ER 60-600 MG PO TB12: 1.0000 | ORAL_TABLET | Freq: Two times a day (BID) | ORAL | Status: DC

## 2011-10-11 NOTE — ED Notes (Signed)
Reports left lower leg pain, denies injury to leg. Ambulatory at triage. Also wants her throat checked bc it "feels scratchy."

## 2011-10-11 NOTE — ED Notes (Signed)
Pt discharged home. Had no further questions. Encouraged to follow up with PCP. 

## 2011-10-11 NOTE — ED Notes (Signed)
Pt presents to department for evaluation of chronic L leg pain and sore throat. States chronic L leg pain for many years. States sore throat x2 days, states her throat is dry. 7/10 pain at the time. Denies recent injury to leg. Ambulatory to exam room. No signs of distress noted.

## 2011-10-11 NOTE — ED Provider Notes (Signed)
History  Scribed for Ethelda Chick, MD, the patient was seen in room TR11C/TR11C. This chart was scribed by Candelaria Stagers. The patient's care started at 2:05 PM   CSN: [id number]  Arrival date & time 10/11/11  1311   First MD Initiated Contact with Patient 10/11/11 1352      Chief Complaint  Patient presents with  . Leg Pain     The history is provided by the patient.   Haley Long is a 61 y.o. female who presents to the Emergency Department complaining of sore throat that started a few days ago.  She denies fever or cough.  She has taken nyquil with little relief.  She is also experiencing rhinorrhea and sinus pressure.  She is also experiencing exacerbation of chronic right leg pain c/o neuropathy.  No dificulty swallowing or breathing, no fever.  She does say she has had drainage from her nose.  There are no other associated systemic symptoms, there are no other alleviating or modifying factors.    Past Medical History  Diagnosis Date  . Hypertension   . Coronary artery disease   . Arthritis     Past Surgical History  Procedure Date  . Pelvic exenteration     History reviewed. No pertinent family history.  History  Substance Use Topics  . Smoking status: Never Smoker   . Smokeless tobacco: Not on file  . Alcohol Use: No    OB History    Grav Para Term Preterm Abortions TAB SAB Ect Mult Living                  Review of Systems  Constitutional: Negative for fever.  HENT: Positive for sore throat, rhinorrhea and sinus pressure. Negative for ear pain.   Respiratory: Negative for cough.   Musculoskeletal: Positive for arthralgias (right leg pain).  All other systems reviewed and are negative.    Allergies  Clonazepam  Home Medications   Current Outpatient Rx  Name Route Sig Dispense Refill  . CALCIUM CARB-CHOLECALCIFEROL 500-600 MG-UNIT PO CHEW Oral Chew 1 tablet by mouth 2 (two) times daily.    Marland Kitchen VITAMIN D3 1000 UNITS PO TABS Oral Take 1,000 Units  by mouth daily.    Marland Kitchen CLONAZEPAM 0.5 MG PO TABS Oral Take 0.25 mg by mouth 2 (two) times daily.    Marland Kitchen FERROUS SULFATE 325 (65 FE) MG PO TABS Oral Take 325 mg by mouth daily.    Marland Kitchen HYDROCHLOROTHIAZIDE 50 MG PO TABS Oral Take 50 mg by mouth daily.    Marland Kitchen HYDROCODONE-ACETAMINOPHEN 5-500 MG PO TABS Oral Take 1 tablet by mouth every 4 (four) hours as needed. For pain    . LOSARTAN POTASSIUM 50 MG PO TABS Oral Take 50 mg by mouth daily.    . ADULT MULTIVITAMIN W/MINERALS CH Oral Take 1 tablet by mouth daily.    Marland Kitchen OMEPRAZOLE 20 MG PO CPDR Oral Take 20 mg by mouth daily.    Marland Kitchen PREGABALIN 50 MG PO CAPS Oral Take 50 mg by mouth 3 (three) times daily.    Marland Kitchen PSEUDOEPHEDRINE-GUAIFENESIN ER 60-600 MG PO TB12 Oral Take 1 tablet by mouth every 12 (twelve) hours. 30 tablet 0    BP 163/76  Pulse 73  Temp 98.1 F (36.7 C) (Oral)  Resp 18  SpO2 98%  Physical Exam  Nursing note and vitals reviewed. Constitutional: She is oriented to person, place, and time. She appears well-developed and well-nourished. No distress.  HENT:  Head: Normocephalic and  atraumatic.       Tenderness of the maxillary sinuses.  Mild erythema of the oropharynx.   Small area of redness on the left nostril.   Eyes: Pupils are equal, round, and reactive to light.  Neck: Normal range of motion.  Pulmonary/Chest: Effort normal.  Musculoskeletal: Normal range of motion.  Lymphadenopathy:    She has no cervical adenopathy.  Neurological: She is alert and oriented to person, place, and time.  Skin: Skin is warm and dry. No rash noted. She is not diaphoretic. No erythema. No pallor.  Psychiatric: She has a normal mood and affect. Her behavior is normal.  Extremities- spider veins on bilateral lower extremities, no cyanosis clubbing or edema  ED Course  Procedures   DIAGNOSTIC STUDIES: Oxygen Saturation is 98% on room air, normal by my interpretation.    COORDINATION OF CARE:  14:10 Rapid Strep screen     Labs Reviewed  RAPID  STREP SCREEN  LAB REPORT - SCANNED   No results found.   1. Upper respiratory infection       MDM  Pt with nasal congestion, sore throat and sinus pressure.  Rapid strep negative.  No acute symptoms involving legs- pt describing her chronic pain symptoms at triage.  Pt given rx for mucinex DM.  Discharged with strict return precautions.  Pt agreeable with plan.   I personally performed the services described in this documentation, which was scribed in my presence. The recorded information has been reviewed and considered.         Ethelda Chick, MD 10/12/11 [phone]

## 2011-10-15 ENCOUNTER — Encounter (HOSPITAL_COMMUNITY): Payer: Self-pay | Admitting: *Deleted

## 2011-10-15 ENCOUNTER — Emergency Department (HOSPITAL_COMMUNITY)
Admission: EM | Admit: 2011-10-15 | Discharge: 2011-10-15 | Discharge status: Against Medical Advice | Payer: Medicare Other | Attending: Emergency Medicine | Admitting: Emergency Medicine

## 2011-10-15 DIAGNOSIS — R5381 Other malaise: Secondary | ICD-10-CM | POA: Insufficient documentation

## 2011-10-15 DIAGNOSIS — R5383 Other fatigue: Secondary | ICD-10-CM | POA: Insufficient documentation

## 2011-10-15 HISTORY — DX: Malignant (primary) neoplasm, unspecified: C80.1

## 2011-10-15 LAB — URINALYSIS, ROUTINE W REFLEX MICROSCOPIC
Bilirubin Urine: NEGATIVE
Glucose, UA: NEGATIVE mg/dL
Hgb urine dipstick: NEGATIVE
Ketones, ur: NEGATIVE mg/dL
Protein, ur: NEGATIVE mg/dL
Urobilinogen, UA: 0.2 mg/dL (ref 0.0–1.0)

## 2011-10-15 NOTE — ED Notes (Signed)
Patient reports she was seen her Saturday and dx with uri.  Patient reports she is having dizziness and near syncope.  Patient brother also passed away this morning

## 2012-02-25 ENCOUNTER — Encounter (HOSPITAL_COMMUNITY): Payer: Self-pay | Admitting: Emergency Medicine

## 2012-02-25 ENCOUNTER — Emergency Department (HOSPITAL_COMMUNITY)
Admission: EM | Admit: 2012-02-25 | Discharge: 2012-02-26 | Disposition: A | Discharge status: Home / Self Care | Payer: Medicare Other | Attending: Emergency Medicine | Admitting: Emergency Medicine

## 2012-02-25 DIAGNOSIS — M7989 Other specified soft tissue disorders: Secondary | ICD-10-CM | POA: Insufficient documentation

## 2012-02-25 DIAGNOSIS — Z79899 Other long term (current) drug therapy: Secondary | ICD-10-CM | POA: Insufficient documentation

## 2012-02-25 DIAGNOSIS — J3489 Other specified disorders of nose and nasal sinuses: Secondary | ICD-10-CM | POA: Insufficient documentation

## 2012-02-25 DIAGNOSIS — I10 Essential (primary) hypertension: Secondary | ICD-10-CM | POA: Insufficient documentation

## 2012-02-25 DIAGNOSIS — Z8739 Personal history of other diseases of the musculoskeletal system and connective tissue: Secondary | ICD-10-CM | POA: Insufficient documentation

## 2012-02-25 DIAGNOSIS — I2510 Atherosclerotic heart disease of native coronary artery without angina pectoris: Secondary | ICD-10-CM | POA: Insufficient documentation

## 2012-02-25 DIAGNOSIS — R609 Edema, unspecified: Secondary | ICD-10-CM | POA: Insufficient documentation

## 2012-02-25 DIAGNOSIS — I499 Cardiac arrhythmia, unspecified: Secondary | ICD-10-CM | POA: Insufficient documentation

## 2012-02-25 DIAGNOSIS — J029 Acute pharyngitis, unspecified: Secondary | ICD-10-CM | POA: Insufficient documentation

## 2012-02-25 HISTORY — DX: Cardiac arrhythmia, unspecified: I49.9

## 2012-02-25 LAB — CBC
HCT: 32.1 % — ABNORMAL LOW (ref 36.0–46.0)
MCH: 29.9 pg (ref 26.0–34.0)
MCHC: 34.9 g/dL (ref 30.0–36.0)
MCV: 85.6 fL (ref 78.0–100.0)
Platelets: 240 10*3/uL (ref 150–400)
RDW: 12.3 % (ref 11.5–15.5)
WBC: 7 10*3/uL (ref 4.0–10.5)

## 2012-02-25 LAB — POCT I-STAT, CHEM 8
Calcium, Ion: 1.18 mmol/L (ref 1.13–1.30)
Creatinine, Ser: 0.8 mg/dL (ref 0.50–1.10)
Glucose, Bld: 90 mg/dL (ref 70–99)
HCT: 33 % — ABNORMAL LOW (ref 36.0–46.0)
Hemoglobin: 11.2 g/dL — ABNORMAL LOW (ref 12.0–15.0)
TCO2: 25 mmol/L (ref 0–100)

## 2012-02-25 MED ORDER — FUROSEMIDE 20 MG PO TABS
20.0000 mg | ORAL_TABLET | Freq: Once | ORAL | Status: AC
  Administered 2012-02-25: 20 mg via ORAL
  Filled 2012-02-25: qty 1

## 2012-02-25 NOTE — ED Provider Notes (Signed)
History     CSN: [id number]  Arrival date & time 02/25/12  2200   First MD Initiated Contact with Patient 02/25/12 2217      Chief Complaint  Patient presents with  . Sore Throat    (Consider location/radiation/quality/duration/timing/severity/associated sxs/prior treatment) HPI Comments: Patient states she's had URI symptoms for the past 2, days, with, a sore throat.  Denies any fever or myalgias, or cough.  She also states, that her lower legs are swollen and painful.  She states a history of cellulitis and multiple courses of antibiotics.  She also states, that she is on Lasix 80 mg once a day, although I do not find this in her medication history.  I will have pharmacy recheck.  This.  She denies any shortness of breath she is able to ambulate , normal distances, but has increased pain in her shins.  She does not report any change in sleeping position.  Tonight.  She is in the emergency department.  Due to the discomfort in her lower legs and concern that she may have an infection  Patient is a 61 y.o. female presenting with pharyngitis. The history is provided by the patient.  Sore Throat This is a new problem. The current episode started yesterday. Pertinent negatives include no chest pain, chills, coughing, fever, headaches, joint swelling, nausea or rash.    Past Medical History  Diagnosis Date  . Hypertension   . Coronary artery disease   . Arthritis   . Cancer   . Irregular heart beat     Past Surgical History  Procedure Date  . Pelvic exenteration   . Abdominal hysterectomy   . Cholecystectomy   . Cataract extraction, bilateral     History reviewed. No pertinent family history.  History  Substance Use Topics  . Smoking status: Never Smoker   . Smokeless tobacco: Not on file  . Alcohol Use: Yes     Comment: occassionally    OB History    Grav Para Term Preterm Abortions TAB SAB Ect Mult Living                  Review of Systems  Constitutional:  Negative for fever and chills.  HENT: Positive for rhinorrhea.   Respiratory: Negative for cough and shortness of breath.   Cardiovascular: Positive for leg swelling. Negative for chest pain.  Gastrointestinal: Negative for nausea.  Musculoskeletal: Negative for joint swelling.  Skin: Negative for rash and wound.  Neurological: Negative for dizziness and headaches.    Allergies  Clonazepam  Home Medications   Current Outpatient Rx  Name  Route  Sig  Dispense  Refill  . CALCIUM CARB-CHOLECALCIFEROL 500-600 MG-UNIT PO CHEW   Oral   Chew 1 tablet by mouth 2 (two) times daily.         Marland Kitchen VITAMIN D3 1000 UNITS PO TABS   Oral   Take 1,000 Units by mouth daily.         Marland Kitchen CLONAZEPAM 0.5 MG PO TABS   Oral   Take 0.25 mg by mouth 2 (two) times daily.         Marland Kitchen FERROUS SULFATE 325 (65 FE) MG PO TABS   Oral   Take 325 mg by mouth daily.         Marland Kitchen HYDROCHLOROTHIAZIDE 50 MG PO TABS   Oral   Take 50 mg by mouth daily.         Marland Kitchen HYDROCODONE-ACETAMINOPHEN 5-500 MG PO TABS   Oral  Take 1 tablet by mouth every 4 (four) hours as needed. For pain         . LOSARTAN POTASSIUM 50 MG PO TABS   Oral   Take 50 mg by mouth daily.         . ADULT MULTIVITAMIN W/MINERALS CH   Oral   Take 1 tablet by mouth daily.         Marland Kitchen OMEPRAZOLE 20 MG PO CPDR   Oral   Take 20 mg by mouth daily.         Marland Kitchen POTASSIUM CHLORIDE CRYS ER 20 MEQ PO TBCR   Oral   Take 20 mEq by mouth daily.         Marland Kitchen PREGABALIN 50 MG PO CAPS   Oral   Take 50 mg by mouth 3 (three) times daily.           BP 118/70  Pulse 64  Temp 97.6 F (36.4 C) (Oral)  Resp 14  SpO2 96%  Physical Exam  Constitutional: She appears well-developed.  HENT:  Head: Normocephalic.  Right Ear: External ear normal.  Left Ear: External ear normal.  Mouth/Throat: Oropharynx is clear and moist.  Eyes: Pupils are equal, round, and reactive to light.  Neck: Normal range of motion.  Pulmonary/Chest: Effort normal.    Abdominal: Soft. She exhibits no distension. There is no tenderness.  Musculoskeletal: Normal range of motion. She exhibits edema and tenderness.       Minimal edema, left greater than right, with slight discoloration of the anterior portions of the shin, left greater than right.  No erythema  Neurological: She is alert.  Skin: Skin is warm.    ED Course  Procedures (including critical care time)  Labs Reviewed  CBC - Abnormal; Notable for the following:    RBC 3.75 (*)     Hemoglobin 11.2 (*)     HCT 32.1 (*)     All other components within normal limits  POCT I-STAT, CHEM 8 - Abnormal; Notable for the following:    Sodium 131 (*)     Potassium 3.0 (*)     Chloride 94 (*)     Hemoglobin 11.2 (*)     HCT 33.0 (*)     All other components within normal limits  RAPID STREP SCREEN   No results found.   1. Pharyngitis   2. Peripheral edema       MDM  Labs reviewed strep test is negative.  Electrolytes, and CBC are normal.  Patient was given one dose of 20 mg Lasix in the emergency department for her.  Minimal peripheral edema.  Have encouraged her to take her medication as directed and followup with Dr. Redmond School at his next available appointment         Arman Filter, NP 02/26/12 0006

## 2012-02-25 NOTE — ED Notes (Signed)
Pt. Presents to the ED with multiple complaints including throat pain.  Pt states that throat pain began yesterday with "little white balls" in her throat.  Pt has yellowish, foul smelling sputum since yesterday.  Denies fever/but pt. Has experienced chills.  Pt also complains of ear pain.  Pt complains of leg swelling and pain bilaterally.

## 2012-02-26 NOTE — ED Provider Notes (Signed)
Medical screening examination/treatment/procedure(s) were performed by non-physician practitioner and as supervising physician I was immediately available for consultation/collaboration.   Loren Racer, MD 02/26/12 [phone]

## 2012-09-08 ENCOUNTER — Other Ambulatory Visit: Payer: Self-pay | Admitting: *Deleted

## 2012-09-08 DIAGNOSIS — I83893 Varicose veins of bilateral lower extremities with other complications: Secondary | ICD-10-CM

## 2012-11-08 ENCOUNTER — Encounter: Payer: Medicare Other | Admitting: Vascular Surgery

## 2012-12-28 ENCOUNTER — Encounter: Payer: Medicare Other | Admitting: Vascular Surgery

## 2013-01-03 ENCOUNTER — Ambulatory Visit (INDEPENDENT_AMBULATORY_CARE_PROVIDER_SITE_OTHER): Payer: Medicare Other | Admitting: Surgery

## 2013-01-03 ENCOUNTER — Ambulatory Visit (HOSPITAL_COMMUNITY)
Admission: RE | Admit: 2013-01-03 | Discharge: 2013-01-03 | Disposition: A | Discharge status: Home / Self Care | Payer: Medicare Other | Source: Ambulatory Visit | Attending: Vascular Surgery | Admitting: Vascular Surgery

## 2013-01-03 ENCOUNTER — Encounter: Payer: Self-pay | Admitting: Surgery

## 2013-01-03 VITALS — BP 154/72 | HR 63 | Ht 61.5 in | Wt 162.0 lb

## 2013-01-03 DIAGNOSIS — I83893 Varicose veins of bilateral lower extremities with other complications: Secondary | ICD-10-CM | POA: Insufficient documentation

## 2013-01-03 DIAGNOSIS — M79609 Pain in unspecified limb: Secondary | ICD-10-CM | POA: Insufficient documentation

## 2013-01-03 DIAGNOSIS — M7989 Other specified soft tissue disorders: Secondary | ICD-10-CM | POA: Insufficient documentation

## 2013-01-03 NOTE — Progress Notes (Signed)
[facility]   Patient name: Haley Long MRN: [id number] DOB: 1951/01/26 Sex: female   Referred by: Dr. Redmond School  Reason for referral:  Chief Complaint  Patient presents with  . Varicose Veins    new pt, bilateral vv's L>R     HISTORY OF PRESENT ILLNESS: This is a very pleasant 62 year old female who has been previously seen and evaluated by office, Dr. Edilia Bo in 2011 for leg swelling. At that time, she reported that her leg swelling had been going on since 2008, around the time she underwent pelvic exenteration for malignancy. She was placed in compression stockings. She did well for a while but for the past several months she has noticed a worsening symptoms in her legs, left greater than right. She complains of swelling that is worse at the end of the day and improved with leg elevation. She also reports more prominence of the varicosities in her leg. These to cause her a fair amount of pain. She has no history of DVT.  The patient's medically managed for hypertension with an ARB. She takes Lyrica for neuropathy. She also suffers from coronary artery disease  Past Medical History  Diagnosis Date  . Hypertension   . Coronary artery disease   . Arthritis   . Cancer   . Irregular heart beat     Past Surgical History  Procedure Laterality Date  . Pelvic exenteration    . Abdominal hysterectomy    . Cholecystectomy    . Cataract extraction, bilateral      History   Social History  . Marital Status: Legally Separated    Spouse Name: N/A    Number of Children: N/A  . Years of Education: N/A   Occupational History  . Not on file.   Social History Main Topics  . Smoking status: Former Smoker    Quit date: 03/31/1969  . Smokeless tobacco: Not on file  . Alcohol Use: No     Comment: occassionally  . Drug Use: No  . Sexual Activity: Not on file   Other Topics Concern  . Not on file   Social History Narrative  . No narrative on file     Family History  Problem Relation Age of Onset  . Hyperlipidemia Mother   . Hypertension Mother   . Other Mother     varicose veins  . Diabetes Father   . Hyperlipidemia Father   . Hypertension Father   . Peripheral vascular disease Father   . Diabetes Sister   . Hyperlipidemia Sister   . Hypertension Sister   . Other Sister     varicose veins  . Hyperlipidemia Brother   . Hypertension Brother   . Peripheral vascular disease Brother   . Heart disease Daughter     before age 19    Allergies as of 01/03/2013 - Review Complete 01/03/2013  Allergen Reaction Noted  . Clonazepam Nausea And Vomiting 06/09/2011    Current Outpatient Prescriptions on File Prior to Visit  Medication Sig Dispense Refill  . ferrous sulfate 325 (65 FE) MG tablet Take 325 mg by mouth daily.      . hydrochlorothiazide (HYDRODIURIL) 50 MG tablet Take 50 mg by mouth daily.      Marland Kitchen losartan (COZAAR) 50 MG tablet Take 50 mg by mouth daily.      . Multiple Vitamin (MULITIVITAMIN WITH MINERALS) TABS Take 1 tablet by mouth daily.      Marland Kitchen omeprazole (PRILOSEC) 20 MG capsule  Take 20 mg by mouth daily.      . potassium chloride SA (K-DUR,KLOR-CON) 20 MEQ tablet Take 20 mEq by mouth daily.      . Calcium Carb-Cholecalciferol 500-600 MG-UNIT CHEW Chew 1 tablet by mouth 2 (two) times daily.      . cholecalciferol (VITAMIN D) 1000 UNITS tablet Take 1,000 Units by mouth daily.      . clonazePAM (KLONOPIN) 0.5 MG tablet Take 0.25 mg by mouth 2 (two) times daily.      Marland Kitchen HYDROcodone-acetaminophen (VICODIN) 5-500 MG per tablet Take 1 tablet by mouth every 4 (four) hours as needed. For pain      . pregabalin (LYRICA) 50 MG capsule Take 50 mg by mouth 3 (three) times daily.       No current facility-administered medications on file prior to visit.     REVIEW OF SYSTEMS: Cardiovascular:  Positive for palpitations/irregular heartbeat. Positive for pain in legs with walking and lying flat. Positive for swelling and  varicosities Pulmonary: No productive cough, asthma or wheezing. Neurologic: Positive for numbness in her legs Hematologic: No bleeding problems or clotting disorders. Musculoskeletal: No joint pain or joint swelling. Gastrointestinal: No blood in stool or hematemesis Genitourinary: No dysuria or hematuria. Psychiatric:: Positive history of major depression. Integumentary: No rashes or ulcers. Constitutional: No fever or chills.  PHYSICAL EXAMINATION: General: The patient appears their stated age.  Vital signs are BP 154/72  Pulse 63  Ht 5' 1.5" (1.562 m)  Wt 162 lb (73.483 kg)  BMI 30.12 kg/m2  SpO2 98% HEENT:  No gross abnormalities Pulmonary: Respirations are non-labored Abdomen: Soft and non-tender . No masses felt Musculoskeletal: There are no major deformities.   Neurologic: No focal weakness or paresthesias are detected, Skin: There are no ulcer or rashes noted. Psychiatric: The patient has normal affect. Cardiovascular: There is a regular rate and rhythm without significant murmur appreciated. Palpable pedal pulse bilaterally. No carotid bruits. Prominent varicosities on the anterior left leg between the knee and ankle  Diagnostic Studies: Venous reflux evaluation was performed today. This shows no evidence of deep vein obstruction or reflux on the right leg. She does have short saphenous reflux on the right with 2 incompetent perforators.  On the left leg there is no evidence of deep vein obstruction or reflux. She does have reflux within the great saphenous vein. Diameter measurements ranged from 0.65-0.48. She has 2 incompetent perforators in the lower leg.    Assessment:  Symptomatic venous insufficiency, left greater than right Plan: I feel that the patient's symptoms are attributable to venous insufficiency. She does have chronic swelling, and therefore there is also a component of lymphedema contributing to her swelling. However, I think with her new onset of  symptoms and findings on ultrasound today that there is also a large component of venous insufficiency, particularly on the left. Her varicosities appear to be painful. They also appeared to be increasing in number and size. I have recommended placing the patient in 20-30 mm compression thigh-high stockings. I like to see how this improves her symptoms. I will have her return in 3 months. At that time we will discuss possibly proceeding with laser ablation of the left great saphenous vein as well as the appendectomy of the prominent varicosities in the left leg.     Juleen China IV, M.D. [facility] [place] Office: [phone] Pager:  [phone]

## 2013-04-04 ENCOUNTER — Encounter: Payer: Self-pay | Admitting: Vascular Surgery

## 2013-04-05 ENCOUNTER — Encounter: Payer: Self-pay | Admitting: Vascular Surgery

## 2013-04-05 ENCOUNTER — Ambulatory Visit (INDEPENDENT_AMBULATORY_CARE_PROVIDER_SITE_OTHER): Payer: Medicare Other | Admitting: Vascular Surgery

## 2013-04-05 VITALS — BP 143/82 | HR 78 | Resp 18 | Ht 61.0 in | Wt 166.6 lb

## 2013-04-05 DIAGNOSIS — I83893 Varicose veins of bilateral lower extremities with other complications: Secondary | ICD-10-CM

## 2013-04-05 NOTE — Progress Notes (Signed)
Problems with Activities of Daily Living Secondary to Leg Pain  1. Mrs. Uplinger states that all activities that require prolonged standing (cooking, cleaning, shopping) are very difficult due to leg pain.  2. Mrs. Whittington states that activities that require prolonged sitting  (riding in car) are very difficult due to leg pain.       Failure of  Conservative Therapy:  1. Worn 20-30 mm Hg thigh high compression hose >3 months with no relief of symptoms.  2. Frequently elevates legs-no relief of symptoms  3. Taken Ibuprofen 600 Mg TID with no relief of symptoms.  The patient presents today for continued discussion regarding her venous hypertension. She reports that the right leg is tolerable. She is having increasingly severe pain in her left leg despite use of graduated compression garments, elevation and ibuprofen. I reviewed her prior venous duplex and also reimage her veins with SonoSite ultrasound. This does show reflux throughout her left great saphenous vein extending into these large telangiectasia over pretibial area.  I feel that she is clearly failed conservative treatment. I have recommended laser ablation of her left great saphenous vein and sclerotherapy of these pronounced a reticular and telangiectasia over pretibial area for symptom relief. She wishes to proceed as soon as possible

## 2013-04-14 ENCOUNTER — Other Ambulatory Visit: Payer: Self-pay | Admitting: *Deleted

## 2013-04-14 DIAGNOSIS — I83893 Varicose veins of bilateral lower extremities with other complications: Secondary | ICD-10-CM

## 2013-04-20 ENCOUNTER — Encounter: Payer: Self-pay | Admitting: Vascular Surgery

## 2013-04-21 ENCOUNTER — Ambulatory Visit (INDEPENDENT_AMBULATORY_CARE_PROVIDER_SITE_OTHER): Payer: Medicare Other | Admitting: Vascular Surgery

## 2013-04-21 ENCOUNTER — Encounter: Payer: Self-pay | Admitting: Vascular Surgery

## 2013-04-21 VITALS — BP 155/92 | HR 72 | Resp 16 | Ht 61.0 in | Wt 165.0 lb

## 2013-04-21 DIAGNOSIS — I83893 Varicose veins of bilateral lower extremities with other complications: Secondary | ICD-10-CM

## 2013-04-21 HISTORY — PX: ENDOVENOUS ABLATION SAPHENOUS VEIN W/ LASER: SUR449

## 2013-04-21 NOTE — Progress Notes (Signed)
   Laser Ablation Procedure      Date: 04/21/2013    Haley Long DOB:[birth year]  Consent signed: Yes  Surgeon:T.F. Early  Procedure: Laser Ablation: left Greater Saphenous Vein  BP 155/92  Pulse 72  Resp 16  Ht 5\' 1"  (1.549 m)  Wt 165 lb (74.844 kg)  BMI 31.19 kg/m2  Start time: 11:00 am   End time: 12:10PM  Tumescent Anesthesia: 400 cc 0.9% NaCl with 50 cc Lidocaine HCL with 1% Epi and 15 cc 8.4% NaHCO3  Local Anesthesia: 3 cc Lidocaine HCL and NaHCO3 (ratio 2:1)  Continuous Mode: 15 Watts Total Energy 1717 Joules Total Time1:54   Sclerotherapy: 0.3 %Sotradecol. Patient received a total of 3 cc   Left calf    Patient tolerated procedure well: Yes    Description of Procedure:  After marking the course of the saphenous vein and the secondary varicosities in the standing position, the patient was placed on the operating table in the supine position, and the left leg was prepped and draped in sterile fashion. Local anesthetic was administered, and under ultrasound guidance the saphenous vein was accessed with a micro needle and guide wire; then the micro puncture sheath was placed. A guide wire was inserted to the saphenofemoral junction, followed by a 5 french sheath.  The position of the sheath and then the laser fiber below the junction was confirmed using the ultrasound and visualization of the aiming beam.  Tumescent anesthesia was administered along the course of the saphenous vein using ultrasound guidance. Protective laser glasses were placed on the patient, and the laser was fired at at 15 watt continuous mode.  For a total of 1717 joules.  A steri strip was applied to the puncture site.   Sclerotherapy was performed to 4 vessels using 3  cc .3% Sotradecol foam via a  30 gauge needle.  ABD pads and thigh high compression stockings were applied.  Ace wrap bandages were applied  at the top of the saphenofemoral junction.  Blood loss was less than 15 cc.  The patient  ambulated out of the operating room having tolerated the procedure well.

## 2013-04-22 ENCOUNTER — Telehealth: Payer: Self-pay | Admitting: *Deleted

## 2013-04-22 ENCOUNTER — Encounter: Payer: Self-pay | Admitting: Vascular Surgery

## 2013-04-22 NOTE — Telephone Encounter (Signed)
    04/22/2013  Time: 1:13 PM   Patient Name: Haley Long  Patient of: T.F. Early  Procedure:Laser Ablation left greater saphenous vein and sclerotherapy left leg 04-21-2013  Reached patient at home and checked  Her status  Yes    Comments/Actions Taken: [name] states that last night she experienced "bleeding from my left leg and I panicked and called 911."  [name] states that EMS told her it was small amount of oozing and had stopped when they arrived.  They reassured her and no further intervention was taken per pt.  [name] states the "oozing" was from small incision left inner knee where the IV was started and states she has had no further bleeding/oozing.  Reviewed with her that small amount of bleeding/oozing was within normal limits for this office surgery.  Reviewed with her how to stop bleeding if it should reoccur.  Reviewed all post procedural instructions with her and reminded her of post LA duplex and follow up appointment with Dr. Donnetta Hutching on 04-28-2013.       @SIGNATURE @

## 2013-04-27 ENCOUNTER — Encounter: Payer: Self-pay | Admitting: Vascular Surgery

## 2013-04-28 ENCOUNTER — Ambulatory Visit (HOSPITAL_COMMUNITY)
Admission: RE | Admit: 2013-04-28 | Discharge: 2013-04-28 | Disposition: A | Discharge status: Home / Self Care | Payer: Medicare Other | Source: Ambulatory Visit | Attending: Vascular Surgery | Admitting: Vascular Surgery

## 2013-04-28 ENCOUNTER — Encounter: Payer: Self-pay | Admitting: Vascular Surgery

## 2013-04-28 ENCOUNTER — Ambulatory Visit (INDEPENDENT_AMBULATORY_CARE_PROVIDER_SITE_OTHER): Payer: Medicare Other | Admitting: Vascular Surgery

## 2013-04-28 VITALS — BP 166/88 | HR 58 | Resp 18 | Ht 61.0 in | Wt 167.0 lb

## 2013-04-28 DIAGNOSIS — I83893 Varicose veins of bilateral lower extremities with other complications: Secondary | ICD-10-CM | POA: Insufficient documentation

## 2013-04-28 NOTE — Progress Notes (Signed)
Here today for one week followup of laser ablation of her left great saphenous vein from the groin to the knee. She does have sclerotherapy to some prominent reticular veins that had bled in the past and her pretibial and lateral calf area.  He has been compliant with her graduated compression garments.  Past Medical History  Diagnosis Date  . Hypertension   . Coronary artery disease   . Arthritis   . Cancer   . Irregular heart beat   . Varicose veins     History  Substance Use Topics  . Smoking status: Former Smoker    Types: Cigarettes    Quit date: 03/31/1969  . Smokeless tobacco: Current User  . Alcohol Use: No     Comment: occassionally    Family History  Problem Relation Age of Onset  . Hyperlipidemia Mother   . Hypertension Mother   . Other Mother     varicose veins  . Diabetes Father   . Hyperlipidemia Father   . Hypertension Father   . Peripheral vascular disease Father   . Diabetes Sister   . Hyperlipidemia Sister   . Hypertension Sister   . Other Sister     varicose veins  . Hyperlipidemia Brother   . Hypertension Brother   . Peripheral vascular disease Brother   . Heart disease Daughter     before age 70    Allergies  Allergen Reactions  . Clonazepam Nausea And Vomiting    Patient claims its just the odt    Current outpatient prescriptions:Calcium Carb-Cholecalciferol 500-600 MG-UNIT CHEW, Chew 1 tablet by mouth 2 (two) times daily., Disp: , Rfl: ;  cetirizine (ZYRTEC) 10 MG tablet, Take 10 mg by mouth daily., Disp: , Rfl: ;  cholecalciferol (VITAMIN D) 1000 UNITS tablet, Take 1,000 Units by mouth daily., Disp: , Rfl: ;  clonazePAM (KLONOPIN) 0.5 MG tablet, Take 0.25 mg by mouth 2 (two) times daily., Disp: , Rfl:  ferrous sulfate 325 (65 FE) MG tablet, Take 325 mg by mouth daily., Disp: , Rfl: ;  hydrochlorothiazide (HYDRODIURIL) 50 MG tablet, Take 50 mg by mouth daily., Disp: , Rfl: ;  HYDROcodone-acetaminophen (VICODIN) 5-500 MG per tablet, Take 1  tablet by mouth every 4 (four) hours as needed. For pain, Disp: , Rfl: ;  losartan (COZAAR) 50 MG tablet, Take 50 mg by mouth daily., Disp: , Rfl:  Multiple Vitamin (MULITIVITAMIN WITH MINERALS) TABS, Take 1 tablet by mouth daily., Disp: , Rfl: ;  omeprazole (PRILOSEC) 20 MG capsule, Take 20 mg by mouth daily., Disp: , Rfl: ;  potassium chloride SA (K-DUR,KLOR-CON) 20 MEQ tablet, Take 20 mEq by mouth daily., Disp: , Rfl: ;  pregabalin (LYRICA) 50 MG capsule, Take 50 mg by mouth 3 (three) times daily., Disp: , Rfl:   BP 166/88  Pulse 58  Resp 18  Ht 5\' 1"  (1.549 m)  Wt 167 lb (75.751 kg)  BMI 31.57 kg/m2  Body mass index is 31.57 kg/(m^2).       On physical exam she has the usual typical mild bruising on the medial aspect of her thigh. The sclerotherapy sites as expected one week out. 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 He does have irritation from the basilic and at the upper portion of her thigh high compression garment.  Duplex today reveals excellent post ablation results with occlusion of her saphenous vein from the knee to the saphenofemoral junction. There is no evidence of DVT  Impression and plan  stable status post ablation of great saphenous vein. She will wear compression for one additional week. We have instructed her on turning her toes inside out to prevent the silicone from touching this area and also suggested hydrocortisone ointment for the irritation. She will see [place] on an as-needed basis

## 2014-02-14 ENCOUNTER — Other Ambulatory Visit: Payer: Self-pay | Admitting: Geriatric Medicine

## 2014-02-14 DIAGNOSIS — Z1231 Encounter for screening mammogram for malignant neoplasm of breast: Secondary | ICD-10-CM

## 2014-03-08 ENCOUNTER — Ambulatory Visit: Payer: Medicare Other

## 2014-03-28 ENCOUNTER — Ambulatory Visit: Payer: Medicare Other

## 2014-04-18 ENCOUNTER — Ambulatory Visit: Payer: Medicare Other

## 2014-05-11 ENCOUNTER — Ambulatory Visit
Admission: RE | Admit: 2014-05-11 | Discharge: 2014-05-11 | Disposition: A | Discharge status: Home / Self Care | Payer: Medicare Other | Source: Ambulatory Visit | Attending: Geriatric Medicine | Admitting: Geriatric Medicine

## 2014-05-11 ENCOUNTER — Ambulatory Visit: Payer: Medicare Other

## 2014-05-11 DIAGNOSIS — Z1231 Encounter for screening mammogram for malignant neoplasm of breast: Secondary | ICD-10-CM

## 2014-08-19 ENCOUNTER — Encounter (HOSPITAL_COMMUNITY): Payer: Self-pay

## 2014-08-19 ENCOUNTER — Emergency Department (HOSPITAL_COMMUNITY)
Admission: EM | Admit: 2014-08-19 | Discharge: 2014-08-19 | Disposition: A | Discharge status: Home / Self Care | Payer: Medicare Other | Attending: Emergency Medicine | Admitting: Emergency Medicine

## 2014-08-19 DIAGNOSIS — Y9289 Other specified places as the place of occurrence of the external cause: Secondary | ICD-10-CM | POA: Diagnosis not present

## 2014-08-19 DIAGNOSIS — Y9389 Activity, other specified: Secondary | ICD-10-CM | POA: Diagnosis not present

## 2014-08-19 DIAGNOSIS — I10 Essential (primary) hypertension: Secondary | ICD-10-CM | POA: Diagnosis not present

## 2014-08-19 DIAGNOSIS — I2510 Atherosclerotic heart disease of native coronary artery without angina pectoris: Secondary | ICD-10-CM | POA: Insufficient documentation

## 2014-08-19 DIAGNOSIS — Z859 Personal history of malignant neoplasm, unspecified: Secondary | ICD-10-CM | POA: Diagnosis not present

## 2014-08-19 DIAGNOSIS — Z79899 Other long term (current) drug therapy: Secondary | ICD-10-CM | POA: Insufficient documentation

## 2014-08-19 DIAGNOSIS — Z87891 Personal history of nicotine dependence: Secondary | ICD-10-CM | POA: Diagnosis not present

## 2014-08-19 DIAGNOSIS — W57XXXA Bitten or stung by nonvenomous insect and other nonvenomous arthropods, initial encounter: Secondary | ICD-10-CM | POA: Insufficient documentation

## 2014-08-19 DIAGNOSIS — M1990 Unspecified osteoarthritis, unspecified site: Secondary | ICD-10-CM | POA: Insufficient documentation

## 2014-08-19 DIAGNOSIS — S40861A Insect bite (nonvenomous) of right upper arm, initial encounter: Secondary | ICD-10-CM | POA: Diagnosis present

## 2014-08-19 DIAGNOSIS — Y998 Other external cause status: Secondary | ICD-10-CM | POA: Diagnosis not present

## 2014-08-19 DIAGNOSIS — R011 Cardiac murmur, unspecified: Secondary | ICD-10-CM | POA: Insufficient documentation

## 2014-08-19 DIAGNOSIS — S40261A Insect bite (nonvenomous) of right shoulder, initial encounter: Secondary | ICD-10-CM | POA: Diagnosis not present

## 2014-08-19 MED ORDER — DOXYCYCLINE HYCLATE 100 MG PO CAPS: 100.0000 mg | ORAL_CAPSULE | Freq: Two times a day (BID) | ORAL | Status: DC

## 2014-08-19 MED ORDER — DIPHENHYDRAMINE HCL 25 MG PO TABS: 25.0000 mg | ORAL_TABLET | Freq: Three times a day (TID) | ORAL | Status: DC | PRN

## 2014-08-19 MED ORDER — DIPHENHYDRAMINE HCL 25 MG PO TABS: 25.0000 mg | ORAL_TABLET | Freq: Four times a day (QID) | ORAL | Status: DC | PRN

## 2014-08-19 NOTE — ED Notes (Signed)
Pt states she noticed a red spot on her right arm and right shoulder yesterday and thinks it's a spider bite. They are replacing cabinets in her apt.

## 2014-08-19 NOTE — ED Notes (Signed)
Declined W/C at D/C and was escorted to lobby by RN. 

## 2014-08-19 NOTE — Discharge Instructions (Signed)
Read the information below.  Use the prescribed medication as directed.  Please discuss all new medications with your pharmacist.  You may return to the Emergency Department at any time for worsening condition or any new symptoms that concern you.    If you develop increased redness, swelling, pus draining from the wound, or fevers greater than 100.4, return to the ER immediately for a recheck.     Insect Bite Mosquitoes, flies, fleas, bedbugs, and many other insects can bite. Insect bites are different from insect stings. A sting is when venom is injected into the skin. Some insect bites can transmit infectious diseases. SYMPTOMS  Insect bites usually turn red, swell, and itch for 2 to 4 days. They often go away on their own. TREATMENT  Your caregiver may prescribe antibiotic medicines if a bacterial infection develops in the bite. HOME CARE INSTRUCTIONS  Do not scratch the bite area.  Keep the bite area clean and dry. Wash the bite area thoroughly with soap and water.  Put ice or cool compresses on the bite area.  Put ice in a plastic bag.  Place a towel between your skin and the bag.  Leave the ice on for 20 minutes, 4 times a day for the first 2 to 3 days, or as directed.  You may apply a baking soda paste, cortisone cream, or calamine lotion to the bite area as directed by your caregiver. This can help reduce itching and swelling.  Only take over-the-counter or prescription medicines as directed by your caregiver.  If you are given antibiotics, take them as directed. Finish them even if you start to feel better. You may need a tetanus shot if:  You cannot remember when you had your last tetanus shot.  You have never had a tetanus shot.  The injury broke your skin. If you get a tetanus shot, your arm may swell, get red, and feel warm to the touch. This is common and not a problem. If you need a tetanus shot and you choose not to have one, there is a rare chance of getting  tetanus. Sickness from tetanus can be serious. SEEK IMMEDIATE MEDICAL CARE IF:   You have increased pain, redness, or swelling in the bite area.  You see a red line on the skin coming from the bite.  You have a fever.  You have joint pain.  You have a headache or neck pain.  You have unusual weakness.  You have a rash.  You have chest pain or shortness of breath.  You have abdominal pain, nausea, or vomiting.  You feel unusually tired or sleepy. MAKE SURE YOU:   Understand these instructions.  Will watch your condition.  Will get help right away if you are not doing well or get worse. Document Released: 04/24/2004 Document Revised: 06/09/2011 Document Reviewed: 10/16/2010 [facility] Patient Information 2015 [place], [place]. This information is not intended to replace advice given to you by your health care provider. Make sure you discuss any questions you have with your health care provider.

## 2014-08-19 NOTE — ED Provider Notes (Signed)
CSN: [id number]     Arrival date & time 08/19/14  1227 History  This chart was scribed for Clayton Bibles, PA-C, working with Leonard Schwartz, MD by Steva Colder, ED Scribe. The patient was seen in room TR06C/TR06C at 1:10 PM.    Chief Complaint  Patient presents with  . Insect Bite      The history is provided by the patient. No language interpreter was used.    HPI Comments: Haley Long is a 64 y.o. female with a medical hx of HTN, CAD, who presents to the Emergency Department complaining of insect bite onset yesterday. Pt noticed a red spot on her right arm and right shoulder that she thinks is a spider bite. Pt reports that the one that is on her right arm is burning, itching, painful, and warmth. Pt reports that she has killed several spiders in her apartments. She states that she is having associated symptoms of warmth and redness. She states that she has tried topical alcohol with no relief for her symptoms. She denies fevers, chills, myalgia, weakness, numbness, and any other symptoms. Pt PCP is Reymundo Poll, MD.   Past Medical History  Diagnosis Date  . Hypertension   . Coronary artery disease   . Arthritis   . Cancer   . Irregular heart beat   . Varicose veins    Past Surgical History  Procedure Laterality Date  . Pelvic exenteration    . Abdominal hysterectomy    . Cholecystectomy    . Cataract extraction, bilateral    . Endovenous ablation saphenous vein w/ laser Left 04-21-2013    left greater saphenous vein and sclerotherapy left leg   Family History  Problem Relation Age of Onset  . Hyperlipidemia Mother   . Hypertension Mother   . Other Mother     varicose veins  . Diabetes Father   . Hyperlipidemia Father   . Hypertension Father   . Peripheral vascular disease Father   . Diabetes Sister   . Hyperlipidemia Sister   . Hypertension Sister   . Other Sister     varicose veins  . Hyperlipidemia Brother   . Hypertension Brother   . Peripheral vascular disease  Brother   . Heart disease Daughter     before age 74   History  Substance Use Topics  . Smoking status: Former Smoker    Types: Cigarettes    Quit date: 03/31/1969  . Smokeless tobacco: Current User  . Alcohol Use: No     Comment: occassionally   OB History    No data available     Review of Systems  Constitutional: Negative for fever and chills.  Respiratory: Negative for shortness of breath.   Cardiovascular: Negative for chest pain.  Gastrointestinal: Negative for vomiting and abdominal pain.  Musculoskeletal: Negative for myalgias.  Skin: Positive for color change and wound (red spots on right arm and right shoulder).  Neurological: Negative for weakness and numbness.      Allergies  Clonazepam  Home Medications   Prior to Admission medications   Medication Sig Start Date End Date Taking? Authorizing Provider  Calcium Carb-Cholecalciferol 500-600 MG-UNIT CHEW Chew 1 tablet by mouth 2 (two) times daily.    Historical Provider, MD  cetirizine (ZYRTEC) 10 MG tablet Take 10 mg by mouth daily.    Historical Provider, MD  cholecalciferol (VITAMIN D) 1000 UNITS tablet Take 1,000 Units by mouth daily.    Historical Provider, MD  clonazePAM (KLONOPIN) 0.5 MG tablet  Take 0.25 mg by mouth 2 (two) times daily.    Historical Provider, MD  diphenhydrAMINE (BENADRYL) 25 MG tablet Take 1 tablet (25 mg total) by mouth every 6 (six) hours as needed for itching. 08/19/14   Clayton Bibles, PA-C  doxycycline (VIBRAMYCIN) 100 MG capsule Take 1 capsule (100 mg total) by mouth 2 (two) times daily. One po bid x 7 days 08/19/14   Clayton Bibles, PA-C  ferrous sulfate 325 (65 FE) MG tablet Take 325 mg by mouth daily.    Historical Provider, MD  hydrochlorothiazide (HYDRODIURIL) 50 MG tablet Take 50 mg by mouth daily.    Historical Provider, MD  HYDROcodone-acetaminophen (VICODIN) 5-500 MG per tablet Take 1 tablet by mouth every 4 (four) hours as needed. For pain    Historical Provider, MD  losartan  (COZAAR) 50 MG tablet Take 50 mg by mouth daily.    Historical Provider, MD  Multiple Vitamin (MULITIVITAMIN WITH MINERALS) TABS Take 1 tablet by mouth daily.    Historical Provider, MD  omeprazole (PRILOSEC) 20 MG capsule Take 20 mg by mouth daily.    Historical Provider, MD  potassium chloride SA (K-DUR,KLOR-CON) 20 MEQ tablet Take 20 mEq by mouth daily.    Historical Provider, MD  pregabalin (LYRICA) 50 MG capsule Take 50 mg by mouth 3 (three) times daily.    Historical Provider, MD   BP 153/78 mmHg  Pulse 77  Temp(Src) 98.1 F (36.7 C) (Oral)  Resp 16  Ht 5\' 1"  (1.549 m)  Wt 176 lb (79.833 kg)  BMI 33.27 kg/m2  SpO2 94% Physical Exam  Constitutional: She appears well-developed and well-nourished. No distress.  HENT:  Head: Normocephalic and atraumatic.  Neck: Neck supple.  Cardiovascular: Normal rate and regular rhythm.   Murmur heard. Pulmonary/Chest: Effort normal and breath sounds normal. No respiratory distress. She has no wheezes. She has no rales.  Abdominal: Soft. She exhibits no distension. There is no tenderness. There is no rebound and no guarding.  Neurological: She is alert. She exhibits normal muscle tone.  Skin: She is not diaphoretic.  Right upper arm with area of erythema, induration, and mild tenderness. Right supraclavicular area with smaller area of induration and erythema. No fluctuance and no discharge.   Psychiatric: She has a normal mood and affect. Her behavior is normal.  Nursing note and vitals reviewed.   ED Course  Procedures (including critical care time) DIAGNOSTIC STUDIES: Oxygen Saturation is 94% on RA, nl by my interpretation.    COORDINATION OF CARE: 1:16 PM-Discussed treatment plan which includes doxycycline, benadryl, f/u with PCP with pt at bedside and pt agreed to plan.   Labs Review Labs Reviewed - No data to display  Imaging Review No results found.   EKG Interpretation None      MDM   Final diagnoses:  Reaction to  insect bite    Afebrile, nontoxic patient with two distinct areas of irritation, appears to be localized reaction to insect bites vs cellulitis.  Clinically no apparent abscess.   D/C home with doxycyline, benadryl.  PCP follow up.  Discussed result, findings, treatment, and follow up  with patient.  Pt given return precautions.  Pt verbalizes understanding and agrees with plan.        I personally performed the services described in this documentation, which was scribed in my presence. The recorded information has been reviewed and is accurate.    Clayton Bibles, PA-C 08/19/14 1456  Leonard Schwartz, MD 08/20/14 [phone]

## 2014-08-26 ENCOUNTER — Emergency Department (HOSPITAL_COMMUNITY)
Admission: EM | Admit: 2014-08-26 | Discharge: 2014-08-26 | Disposition: A | Discharge status: Home / Self Care | Payer: Medicare Other | Attending: Emergency Medicine | Admitting: Emergency Medicine

## 2014-08-26 ENCOUNTER — Encounter (HOSPITAL_COMMUNITY): Payer: Self-pay | Admitting: *Deleted

## 2014-08-26 DIAGNOSIS — W57XXXA Bitten or stung by nonvenomous insect and other nonvenomous arthropods, initial encounter: Secondary | ICD-10-CM

## 2014-08-26 DIAGNOSIS — Z8541 Personal history of malignant neoplasm of cervix uteri: Secondary | ICD-10-CM | POA: Diagnosis not present

## 2014-08-26 DIAGNOSIS — Z792 Long term (current) use of antibiotics: Secondary | ICD-10-CM | POA: Insufficient documentation

## 2014-08-26 DIAGNOSIS — Z87891 Personal history of nicotine dependence: Secondary | ICD-10-CM | POA: Insufficient documentation

## 2014-08-26 DIAGNOSIS — Z79899 Other long term (current) drug therapy: Secondary | ICD-10-CM | POA: Diagnosis not present

## 2014-08-26 DIAGNOSIS — Y92009 Unspecified place in unspecified non-institutional (private) residence as the place of occurrence of the external cause: Secondary | ICD-10-CM | POA: Diagnosis not present

## 2014-08-26 DIAGNOSIS — M1990 Unspecified osteoarthritis, unspecified site: Secondary | ICD-10-CM | POA: Insufficient documentation

## 2014-08-26 DIAGNOSIS — S40861A Insect bite (nonvenomous) of right upper arm, initial encounter: Secondary | ICD-10-CM | POA: Diagnosis not present

## 2014-08-26 DIAGNOSIS — I2510 Atherosclerotic heart disease of native coronary artery without angina pectoris: Secondary | ICD-10-CM | POA: Insufficient documentation

## 2014-08-26 DIAGNOSIS — I10 Essential (primary) hypertension: Secondary | ICD-10-CM | POA: Insufficient documentation

## 2014-08-26 DIAGNOSIS — Y9389 Activity, other specified: Secondary | ICD-10-CM | POA: Diagnosis not present

## 2014-08-26 DIAGNOSIS — S1096XA Insect bite of unspecified part of neck, initial encounter: Secondary | ICD-10-CM | POA: Insufficient documentation

## 2014-08-26 DIAGNOSIS — Y998 Other external cause status: Secondary | ICD-10-CM | POA: Diagnosis not present

## 2014-08-26 MED ORDER — DIPHENHYDRAMINE HCL 25 MG PO TABS: 25.0000 mg | ORAL_TABLET | Freq: Three times a day (TID) | ORAL | Status: DC | PRN

## 2014-08-26 MED ORDER — HYDROCORTISONE 1 % EX CREA: TOPICAL_CREAM | CUTANEOUS | Status: DC

## 2014-08-26 NOTE — ED Notes (Signed)
Pt has multiple insect bites to neck and R arm.

## 2014-08-26 NOTE — ED Provider Notes (Signed)
CSN: [id number]     Arrival date & time 08/26/14  1413 History  This chart was scribed for Jamse Mead, PA-C, working with Nat Christen, MD by Steva Colder, ED Scribe. The patient was seen in room TR05C/TR05C at 3:12 PM.    Chief Complaint  Patient presents with  . Insect Bite      The history is provided by the patient. No language interpreter was used.    Haley Long is a 64 y.o. female with a PMHx of HTN, CAD, Arthritis, CA, irregular heart beat, and varicose veins who presents to the Emergency Department complaining of insect bite to neck and right arm onset yesterday. Patient reported that she has a lot of plants in her house and believes an insect for her planned that her. Reports she is to bite on her right arm and 1 bite on her neck. Patient reported that she's been using Benadryl topical with out relief. Stated that she was using Benadryl by mouth, but states that she only has 2 tablets left. Patient denied bleeding, fever, drainage, chest pain, shortness of breath, difficulty breathing, throat closing sensation, tongue swelling, blurred vision, sudden loss of vision, neck pain, neck stiffness, nausea, vomiting, fainting. PCP Dr. Reymundo Poll   Past Medical History  Diagnosis Date  . Hypertension   . Coronary artery disease   . Arthritis   . Irregular heart beat   . Varicose veins   . Cancer     uterine   Past Surgical History  Procedure Laterality Date  . Pelvic exenteration    . Abdominal hysterectomy    . Cholecystectomy    . Cataract extraction, bilateral    . Endovenous ablation saphenous vein w/ laser Left 04-21-2013    left greater saphenous vein and sclerotherapy left leg   Family History  Problem Relation Age of Onset  . Hyperlipidemia Mother   . Hypertension Mother   . Other Mother     varicose veins  . Diabetes Father   . Hyperlipidemia Father   . Hypertension Father   . Peripheral vascular disease Father   . Diabetes Sister   . Hyperlipidemia  Sister   . Hypertension Sister   . Other Sister     varicose veins  . Hyperlipidemia Brother   . Hypertension Brother   . Peripheral vascular disease Brother   . Heart disease Daughter     before age 68   History  Substance Use Topics  . Smoking status: Former Smoker    Types: Cigarettes    Quit date: 03/31/1969  . Smokeless tobacco: Current User  . Alcohol Use: Yes     Comment: occassionally   OB History    No data available     Review of Systems  HENT: Negative for trouble swallowing.   Eyes: Negative for visual disturbance.  Respiratory: Negative for shortness of breath.   Cardiovascular: Negative for chest pain.  Gastrointestinal: Negative for nausea and vomiting.  Musculoskeletal: Negative for neck pain and neck stiffness.  Skin: Positive for rash.  Neurological: Negative for syncope.      Allergies  Clonazepam  Home Medications   Prior to Admission medications   Medication Sig Start Date End Date Taking? Authorizing Provider  Calcium Carb-Cholecalciferol 500-600 MG-UNIT CHEW Chew 1 tablet by mouth 2 (two) times daily.    Historical Provider, MD  cetirizine (ZYRTEC) 10 MG tablet Take 10 mg by mouth daily.    Historical Provider, MD  cholecalciferol (VITAMIN D) 1000 UNITS tablet  Take 1,000 Units by mouth daily.    Historical Provider, MD  clonazePAM (KLONOPIN) 0.5 MG tablet Take 0.25 mg by mouth 2 (two) times daily.    Historical Provider, MD  doxycycline (VIBRAMYCIN) 100 MG capsule Take 1 capsule (100 mg total) by mouth 2 (two) times daily. One po bid x 7 days 08/19/14   Clayton Bibles, PA-C  ferrous sulfate 325 (65 FE) MG tablet Take 325 mg by mouth daily.    Historical Provider, MD  hydrochlorothiazide (HYDRODIURIL) 50 MG tablet Take 50 mg by mouth daily.    Historical Provider, MD  HYDROcodone-acetaminophen (VICODIN) 5-500 MG per tablet Take 1 tablet by mouth every 4 (four) hours as needed. For pain    Historical Provider, MD  hydrocortisone cream 1 % Apply to  affected area 2 times daily 08/26/14   Jeremaih Klima, PA-C  losartan (COZAAR) 50 MG tablet Take 50 mg by mouth daily.    Historical Provider, MD  Multiple Vitamin (MULITIVITAMIN WITH MINERALS) TABS Take 1 tablet by mouth daily.    Historical Provider, MD  omeprazole (PRILOSEC) 20 MG capsule Take 20 mg by mouth daily.    Historical Provider, MD  potassium chloride SA (K-DUR,KLOR-CON) 20 MEQ tablet Take 20 mEq by mouth daily.    Historical Provider, MD  pregabalin (LYRICA) 50 MG capsule Take 50 mg by mouth 3 (three) times daily.    Historical Provider, MD   BP 152/82 mmHg  Pulse 86  Temp(Src) 98.3 F (36.8 C) (Oral)  Resp 16  Ht 5\' 1"  (1.549 m)  Wt 167 lb (75.751 kg)  BMI 31.57 kg/m2  SpO2 93% Physical Exam  Constitutional: She is oriented to person, place, and time. She appears well-developed and well-nourished. No distress.  HENT:  Head: Normocephalic and atraumatic.  Mouth/Throat: Oropharynx is clear and moist. No oropharyngeal exudate.  Negative tongue swelling Negative angioedema  Eyes: Conjunctivae and EOM are normal. Pupils are equal, round, and reactive to light. Right eye exhibits no discharge. Left eye exhibits no discharge.  Neck: Normal range of motion. Neck supple. No tracheal deviation present.  Cardiovascular: Normal rate, regular rhythm and normal heart sounds.  Exam reveals no friction rub.   No murmur heard. Pulmonary/Chest: Effort normal and breath sounds normal. No respiratory distress. She has no wheezes. She has no rales.  Musculoskeletal: Normal range of motion.  Lymphadenopathy:    She has no cervical adenopathy.  Neurological: She is alert and oriented to person, place, and time. No cranial nerve deficit. She exhibits normal muscle tone. Coordination normal. GCS eye subscore is 4. GCS verbal subscore is 5. GCS motor subscore is 6.  Skin: Skin is dry. Rash noted. She is not diaphoretic. There is erythema.  Insect bite identified to the right forearm, 2. Area  of bite palpated with surrounding erythema. Negative active drainage or bleeding. Negative areas of fluctuance or induration. Negative findings of red streaks.  Psychiatric: She has a normal mood and affect. Her behavior is normal. Thought content normal.  Nursing note and vitals reviewed.   ED Course  Procedures (including critical care time) DIAGNOSTIC STUDIES: Oxygen Saturation is 93% on RA, nl by my interpretation.    COORDINATION OF CARE: 3:19 PM-Discussed treatment plan which includes hydrocortisone cream with pt at bedside and pt agreed to plan.   Labs Review Labs Reviewed - No data to display  Imaging Review No results found.   EKG Interpretation None       3:36 PM Dr. Lacinda Axon at bedside.  MDM   Final diagnoses:  Insect bite    Medications - No data to display  Filed Vitals:   08/26/14 1418  BP: 152/82  Pulse: 86  Temp: 98.3 F (36.8 C)  TempSrc: Oral  Resp: 16  Height: 5\' 1"  (1.549 m)  Weight: 167 lb (75.751 kg)  SpO2: 93%   I personally performed the services described in this documentation, which was scribed in my presence. The recorded information has been reviewed and is accurate.  This provider reviewed patient's chart. Patient was seen and assessed in ED setting on 08/19/2014 where she was diagnosed with insect bite and started on doxycycline and Benadryl. Patient reported that she finished doxycycline approximately 2 days ago, but only has 2 tablets of Benadryl left. Insect bite appearing lesion localized to the right forearm and just above the clavicle of the right side. Surrounding erythema noted with warmth upon palpation. Negative active drainage or bleeding noted. Negative palpation of induration or fluctuance noted-no abscess noted at this time. Negative tongue swelling. Negative angioedema. Negative signs of anaphylactic reaction. Patient stable, afebrile. Patient not septic appearing. Negative signs of respiratory distress. Patient seen and  assessed by attending physician, Dr. Lacinda Axon who recommended patient to be discharged home with topical hydrocortisone cream. Discharged patient. Referred patient to PCP. Discussed with patient to follow-up by the beginning of the week and for patient to follow-up with an exterminator for her house. Discussed with patient to closely monitor symptoms and if symptoms are to worsen or change to report back to the ED - strict return instructions given.  Patient agreed to plan of care, understood, all questions answered.   Jamse Mead, PA-C 08/26/14 [place], MD 08/26/14 [phone]

## 2014-08-26 NOTE — Discharge Instructions (Signed)
Please call your doctor for a followup appointment within 24-48 hours. When you talk to your doctor please let them know that you were seen in the emergency department and have them acquire all of your records so that they can discuss the findings with you and formulate a treatment plan to fully care for your new and ongoing problems. Please follow-up with primary care provider Please get in allergy test performed Please apply hydrocortisone cream to affected area of the bug bites at least 2 times per day Please apply cool compressions Please continue to monitor symptoms closely and if symptoms are to worsen or change (fever greater than 101, chills, sweating, nausea, vomiting, chest pain, shortness of breathe, difficulty breathing, weakness, numbness, tingling, worsening or changes to pain pattern, swelling, red streaks, pus drainage, bleeding, numbness, tingling, changes to the arm, swelling to the arm) please report back to the Emergency Department immediately.   Insect Bite Mosquitoes, flies, fleas, bedbugs, and many other insects can bite. Insect bites are different from insect stings. A sting is when venom is injected into the skin. Some insect bites can transmit infectious diseases. SYMPTOMS  Insect bites usually turn red, swell, and itch for 2 to 4 days. They often go away on their own. TREATMENT  Your caregiver may prescribe antibiotic medicines if a bacterial infection develops in the bite. HOME CARE INSTRUCTIONS  Do not scratch the bite area.  Keep the bite area clean and dry. Wash the bite area thoroughly with soap and water.  Put ice or cool compresses on the bite area.  Put ice in a plastic bag.  Place a towel between your skin and the bag.  Leave the ice on for 20 minutes, 4 times a day for the first 2 to 3 days, or as directed.  You may apply a baking soda paste, cortisone cream, or calamine lotion to the bite area as directed by your caregiver. This can help reduce itching  and swelling.  Only take over-the-counter or prescription medicines as directed by your caregiver.  If you are given antibiotics, take them as directed. Finish them even if you start to feel better. You may need a tetanus shot if:  You cannot remember when you had your last tetanus shot.  You have never had a tetanus shot.  The injury broke your skin. If you get a tetanus shot, your arm may swell, get red, and feel warm to the touch. This is common and not a problem. If you need a tetanus shot and you choose not to have one, there is a rare chance of getting tetanus. Sickness from tetanus can be serious. SEEK IMMEDIATE MEDICAL CARE IF:   You have increased pain, redness, or swelling in the bite area.  You see a red line on the skin coming from the bite.  You have a fever.  You have joint pain.  You have a headache or neck pain.  You have unusual weakness.  You have a rash.  You have chest pain or shortness of breath.  You have abdominal pain, nausea, or vomiting.  You feel unusually tired or sleepy. MAKE SURE YOU:   Understand these instructions.  Will watch your condition.  Will get help right away if you are not doing well or get worse. Document Released: 04/24/2004 Document Revised: 06/09/2011 Document Reviewed: 10/16/2010 [facility] Patient Information 2015 [place], [place]. This information is not intended to replace advice given to you by your health care provider. Make sure you discuss any questions  you have with your health care provider.

## 2014-09-02 ENCOUNTER — Encounter (HOSPITAL_COMMUNITY): Payer: Self-pay | Admitting: Emergency Medicine

## 2014-09-02 ENCOUNTER — Emergency Department (HOSPITAL_COMMUNITY)
Admission: EM | Admit: 2014-09-02 | Discharge: 2014-09-02 | Disposition: A | Discharge status: Home / Self Care | Payer: Medicare Other | Attending: Emergency Medicine | Admitting: Emergency Medicine

## 2014-09-02 DIAGNOSIS — Z7952 Long term (current) use of systemic steroids: Secondary | ICD-10-CM | POA: Diagnosis not present

## 2014-09-02 DIAGNOSIS — Z8739 Personal history of other diseases of the musculoskeletal system and connective tissue: Secondary | ICD-10-CM | POA: Diagnosis not present

## 2014-09-02 DIAGNOSIS — Z87891 Personal history of nicotine dependence: Secondary | ICD-10-CM | POA: Insufficient documentation

## 2014-09-02 DIAGNOSIS — Y9289 Other specified places as the place of occurrence of the external cause: Secondary | ICD-10-CM | POA: Diagnosis not present

## 2014-09-02 DIAGNOSIS — S50861A Insect bite (nonvenomous) of right forearm, initial encounter: Secondary | ICD-10-CM | POA: Insufficient documentation

## 2014-09-02 DIAGNOSIS — Z792 Long term (current) use of antibiotics: Secondary | ICD-10-CM | POA: Insufficient documentation

## 2014-09-02 DIAGNOSIS — I10 Essential (primary) hypertension: Secondary | ICD-10-CM | POA: Insufficient documentation

## 2014-09-02 DIAGNOSIS — S20469A Insect bite (nonvenomous) of unspecified back wall of thorax, initial encounter: Secondary | ICD-10-CM | POA: Diagnosis not present

## 2014-09-02 DIAGNOSIS — Z79899 Other long term (current) drug therapy: Secondary | ICD-10-CM | POA: Insufficient documentation

## 2014-09-02 DIAGNOSIS — I2510 Atherosclerotic heart disease of native coronary artery without angina pectoris: Secondary | ICD-10-CM | POA: Insufficient documentation

## 2014-09-02 DIAGNOSIS — L03113 Cellulitis of right upper limb: Secondary | ICD-10-CM | POA: Insufficient documentation

## 2014-09-02 DIAGNOSIS — S40861A Insect bite (nonvenomous) of right upper arm, initial encounter: Secondary | ICD-10-CM | POA: Insufficient documentation

## 2014-09-02 DIAGNOSIS — Z8542 Personal history of malignant neoplasm of other parts of uterus: Secondary | ICD-10-CM | POA: Insufficient documentation

## 2014-09-02 DIAGNOSIS — W57XXXA Bitten or stung by nonvenomous insect and other nonvenomous arthropods, initial encounter: Secondary | ICD-10-CM | POA: Insufficient documentation

## 2014-09-02 DIAGNOSIS — Y9389 Activity, other specified: Secondary | ICD-10-CM | POA: Insufficient documentation

## 2014-09-02 DIAGNOSIS — S50862A Insect bite (nonvenomous) of left forearm, initial encounter: Secondary | ICD-10-CM | POA: Diagnosis not present

## 2014-09-02 DIAGNOSIS — Y998 Other external cause status: Secondary | ICD-10-CM | POA: Diagnosis not present

## 2014-09-02 MED ORDER — CEPHALEXIN 500 MG PO CAPS: 500.0000 mg | ORAL_CAPSULE | Freq: Four times a day (QID) | ORAL | Status: DC

## 2014-09-02 MED ORDER — HYDROCORTISONE 1 % EX CREA: TOPICAL_CREAM | CUTANEOUS | Status: DC

## 2014-09-02 NOTE — ED Provider Notes (Signed)
CSN: [id number]     Arrival date & time 09/02/14  1933 History  This chart was scribed for Haley Bailiff, PA-C working with No att. providers found by Mercy Moore, ED Scribe. This patient was seen in room TR05C/TR05C and the patient's care was started at 8:58 PM.   Chief Complaint  Patient presents with  . Insect Bite   The history is provided by the patient. No language interpreter was used.   HPI Comments: Haley Long is a 64 y.o. female who presents to the Emergency Department complaining of swelling, redness and warmth to right elbow secondary to bug bite last night. Patient presents with the bug that bit her in her purse; she reports blood squirting from it when she caught and squished it. Patient suspects it is a bed bug. Patient evaluated twice here in ED last month for insect bites; discharged with Doxycycline, hydrocortisone cream and Benadryl. Patient reports that she is having her apartment exterminated.  Patient denies fever, or chills, vomiting,   Past Medical History  Diagnosis Date  . Hypertension   . Coronary artery disease   . Arthritis   . Irregular heart beat   . Varicose veins   . Cancer     uterine   Past Surgical History  Procedure Laterality Date  . Pelvic exenteration    . Abdominal hysterectomy    . Cholecystectomy    . Cataract extraction, bilateral    . Endovenous ablation saphenous vein w/ laser Left 04-21-2013    left greater saphenous vein and sclerotherapy left leg   Family History  Problem Relation Age of Onset  . Hyperlipidemia Mother   . Hypertension Mother   . Other Mother     varicose veins  . Diabetes Father   . Hyperlipidemia Father   . Hypertension Father   . Peripheral vascular disease Father   . Diabetes Sister   . Hyperlipidemia Sister   . Hypertension Sister   . Other Sister     varicose veins  . Hyperlipidemia Brother   . Hypertension Brother   . Peripheral vascular disease Brother   . Heart disease Daughter     before age  26   History  Substance Use Topics  . Smoking status: Former Smoker    Types: Cigarettes    Quit date: 03/31/1969  . Smokeless tobacco: Current User  . Alcohol Use: Yes     Comment: occassionally   OB History    No data available     Review of Systems  Constitutional: Negative for fever and chills.  Gastrointestinal: Negative for vomiting.  Skin: Positive for color change.    Allergies  Clonazepam  Home Medications   Prior to Admission medications   Medication Sig Start Date End Date Taking? Authorizing Provider  Calcium Carb-Cholecalciferol 500-600 MG-UNIT CHEW Chew 1 tablet by mouth 2 (two) times daily.    Historical Provider, MD  cephALEXin (KEFLEX) 500 MG capsule Take 1 capsule (500 mg total) by mouth 4 (four) times daily. 09/02/14   Haley Bailiff, PA-C  cetirizine (ZYRTEC) 10 MG tablet Take 10 mg by mouth daily.    Historical Provider, MD  cholecalciferol (VITAMIN D) 1000 UNITS tablet Take 1,000 Units by mouth daily.    Historical Provider, MD  clonazePAM (KLONOPIN) 0.5 MG tablet Take 0.25 mg by mouth 2 (two) times daily.    Historical Provider, MD  doxycycline (VIBRAMYCIN) 100 MG capsule Take 1 capsule (100 mg total) by mouth 2 (two) times daily. One po  bid x 7 days 08/19/14   Clayton Bibles, PA-C  ferrous sulfate 325 (65 FE) MG tablet Take 325 mg by mouth daily.    Historical Provider, MD  hydrochlorothiazide (HYDRODIURIL) 50 MG tablet Take 50 mg by mouth daily.    Historical Provider, MD  HYDROcodone-acetaminophen (VICODIN) 5-500 MG per tablet Take 1 tablet by mouth every 4 (four) hours as needed. For pain    Historical Provider, MD  hydrocortisone cream 1 % Apply to affected area 2 times daily 09/02/14   Haley Bailiff, PA-C  losartan (COZAAR) 50 MG tablet Take 50 mg by mouth daily.    Historical Provider, MD  Multiple Vitamin (MULITIVITAMIN WITH MINERALS) TABS Take 1 tablet by mouth daily.    Historical Provider, MD  omeprazole (PRILOSEC) 20 MG capsule Take 20 mg by mouth daily.     Historical Provider, MD  potassium chloride SA (K-DUR,KLOR-CON) 20 MEQ tablet Take 20 mEq by mouth daily.    Historical Provider, MD  pregabalin (LYRICA) 50 MG capsule Take 50 mg by mouth 3 (three) times daily.    Historical Provider, MD   Triage Vitals: BP 163/65 mmHg  Pulse 67  Temp(Src) 97.7 F (36.5 C) (Oral)  Resp 17  SpO2 96% Physical Exam  Constitutional: She is oriented to person, place, and time. She appears well-developed and well-nourished. No distress.  HENT:  Head: Normocephalic and atraumatic.  Eyes: EOM are normal.  Neck: Neck supple. No tracheal deviation present.  Cardiovascular: Normal rate.   Pulmonary/Chest: Effort normal. No respiratory distress.  Musculoskeletal: Normal range of motion.  Right arm: Small amount of erythema and mild amount of induration on lateral epicondyle.   Neurological: She is alert and oriented to person, place, and time.  Skin: Skin is warm and dry.  Multiple, small papules noted on patient's upper right arm, left forearm, right forearm, back. Small amount of erythema and mild amount of induration to lateral epicondyle. Full range of motion of elbow. No bursitis or elbow swelling. 5 out of 5 motor strength noted in the shoulder, elbow, wrist. Radial pulse 2+. Distal sensation intact.  Psychiatric: She has a normal mood and affect. Her behavior is normal.  Nursing note and vitals reviewed.   ED Course  Procedures (including critical care time)  COORDINATION OF CARE: 9:11 PM- Discussed treatment plan with patient at bedside and patient agreed to plan.   Labs Review Labs Reviewed - No data to display  Imaging Review No results found.   EKG Interpretation None      MDM   Final diagnoses:  Bed bug bite   No evidence of SJS or necrotizing fasciitis. Due to pruritic and not painful nature of blisters do not suspect pemphigus vulgaris. Pustules do not resemble scabies as per pt hx or allergic reaction. No blisters, no pustules, no  warmth, no draining sinus tracts, no superficial abscesses, no bullous impetigo, no vesicles, no desquamation, no target lesions with dusky purpura or a central bulla. Not tender to touch.   Patient here with multiple bug bites, consistent with bedbug bites. Patient has been seen for similar bites in the past, states she has not had exterminator, however she is having exterminator come in 3 days. Mild amount of cellulitis noted to right forearm. No abscess. No systemic signs or symptoms. Patient is afebrile, hemodynamically stable and in no acute distress. Mild amount of cellulitis has no central pallor, or is no target lesion or skin sloughing. No purpura, petechiae noted. Patient was treated with Keflex  for mild cellulitis, strongly encouraged to follow-up in 2 days for wound recheck. Encouraged to follow with primary care physician within a week. Provided education regarding bedbugs, extermination, discussed return precautions with patient, patient verbalizes understanding and agreement of this plan.  I personally performed the services described in this documentation, which was scribed in my presence. The recorded information has been reviewed and is accurate.  BP 144/67 mmHg  Pulse 66  Temp(Src) 97.7 F (36.5 C) (Oral)  Resp 16  SpO2 98%  Signed,  Haley Bailiff, PA-C 1:27 AM    Haley Bailiff, PA-C 09/03/14 0127  Ezequiel Essex, MD 09/03/14 [phone]

## 2014-09-02 NOTE — Discharge Instructions (Signed)
Return to the ER for a wound re-check in 48 hours or follow up with your doctor early next week.  Return to the ER with any spreading of infection, nausea, vomiting, high fever, worsening of your symptoms.    Bedbugs Bedbugs are tiny bugs that live in and around beds. During the day, they hide in mattresses and other places near beds. They come out at night and bite people lying in bed. They need blood to live and grow. Bedbugs can be found in beds anywhere. Usually, they are found in places where many people come and go (hotels, shelters, hospitals). It does not matter whether the place is dirty or clean. Getting bitten by bedbugs rarely causes a medical problem. The biggest problem can be getting rid of them. This often takes the work of a Financial risk analyst. CAUSES  Less use of pesticides. Bedbugs were common before the 1950s. Then, strong pesticides such as DDT nearly wiped them out. Today, these pesticides are not used because they harm the environment and can cause health problems.  More travel. Besides mattresses, bedbugs can also live in clothing and luggage. They can come along as people travel from place to place. Bedbugs are more common in certain parts of the world. When people travel to those areas, the bugs can come home with them.  Presence of birds and bats. Bedbugs often infest birds and bats. If you have these animals in or near your home, bedbugs may infest your house, too. SYMPTOMS It does not hurt to be bitten by a bedbug. You will probably not wake up when you are bitten. Bedbugs usually bite areas of the skin that are not covered. Symptoms may show when you wake up, or they may take a day or more to show up. Symptoms may include:  Small red bumps on the skin. These might be lined up in a row or clustered in a group.  A darker red dot in the middle of red bumps.  Blisters on the skin. There may be swelling and very bad itching. These may be signs of an allergic reaction.  This does not happen often. DIAGNOSIS Bedbug bites might look and feel like other types of insect bites. The bugs do not stay on the body like ticks or lice. They bite, drop off, and crawl away to hide. Your caregiver will probably:  Ask about your symptoms.  Ask about your recent activities and travel.  Check your skin for bedbug bites.  Ask you to check at home for signs of bedbugs. You should look for:  Spots or stains on the bed or nearby. This could be from bedbugs that were crushed or from their eggs or waste.  Bedbugs themselves. They are reddish-brown, oval, and flat. They do not fly. They are about the size of an apple seed.  Places to look for bedbugs include:  Beds. Check mattresses, headboards, box springs, and bed frames.  On drapes and curtains near the bed.  Under carpeting in the bedroom.  Behind electrical outlets.  Behind any wallpaper that is peeling.  Inside luggage. TREATMENT Most bedbug bites do not need treatment. They usually go away on their own in a few days. The bites are not dangerous. However, treatment may be needed if you have scratched so much that your skin has become infected. You may also need treatment if you are allergic to bedbug bites. Treatment options include:  A drug that stops swelling and itching (corticosteroid). Usually, a cream is rubbed  on the skin. If you have a bad rash, you may be given a corticosteroid pill.  Oral antihistamines. These are pills to help control itching.  Antibiotic medicines. An antibiotic may be prescribed for infected skin. HOME CARE INSTRUCTIONS   Take any medicine prescribed by your caregiver for your bites. Follow the directions carefully.  Consider wearing pajamas with long sleeves and pant legs.  Your bedroom may need to be treated. A pest control expert should make sure the bedbugs are gone. You may need to throw away mattresses or luggage. Ask the pest control expert what you can do to keep the  bedbugs from coming back. Common suggestions include:  Putting a plastic cover over your mattress.  Washing and drying your clothes and bedding in hot water and a hot dryer. The temperature should be hotter than 120 F (48.9 C). Bedbugs are killed by high temperatures.  Vacuuming carefully all around your bed. Vacuum in all cracks and crevices where the bugs might hide. Do this often.  Carefully checking all used furniture, bedding, or clothes that you bring into your house.  Eliminating bird nests and bat roosts.  If you get bedbug bites when traveling, check all your possessions carefully before bringing them into your house. If you find any bugs on clothes or in your luggage, consider throwing those items away. SEEK MEDICAL CARE IF:  You have red bug bites that keep coming back.  You have red bug bites that itch badly.  You have bug bites that cause a skin rash.  You have scratch marks that are red and sore. SEEK IMMEDIATE MEDICAL CARE IF: You have a fever. Document Released: 04/19/2010 Document Revised: 06/09/2011 Document Reviewed: 04/19/2010 [facility] Patient Information 2015 [place], [place]. This information is not intended to replace advice given to you by your health care provider. Make sure you discuss any questions you have with your health care provider.  Cellulitis Cellulitis is an infection of the skin and the tissue beneath it. The infected area is usually red and tender. Cellulitis occurs most often in the arms and lower legs.  CAUSES  Cellulitis is caused by bacteria that enter the skin through cracks or cuts in the skin. The most common types of bacteria that cause cellulitis are staphylococci and streptococci. SIGNS AND SYMPTOMS   Redness and warmth.  Swelling.  Tenderness or pain.  Fever. DIAGNOSIS  Your health care provider can usually determine what is wrong based on a physical exam. Blood tests may also be done. TREATMENT  Treatment usually involves  taking an antibiotic medicine. HOME CARE INSTRUCTIONS   Take your antibiotic medicine as directed by your health care provider. Finish the antibiotic even if you start to feel better.  Keep the infected arm or leg elevated to reduce swelling.  Apply a warm cloth to the affected area up to 4 times per day to relieve pain.  Take medicines only as directed by your health care provider.  Keep all follow-up visits as directed by your health care provider. SEEK MEDICAL CARE IF:   You notice red streaks coming from the infected area.  Your red area gets larger or turns dark in color.  Your bone or joint underneath the infected area becomes painful after the skin has healed.  Your infection returns in the same area or another area.  You notice a swollen bump in the infected area.  You develop new symptoms.  You have a fever. SEEK IMMEDIATE MEDICAL CARE IF:   You feel  very sleepy.  You develop vomiting or diarrhea.  You have a general ill feeling (malaise) with muscle aches and pains. MAKE SURE YOU:   Understand these instructions.  Will watch your condition.  Will get help right away if you are not doing well or get worse. Document Released: 12/25/2004 Document Revised: 08/01/2013 Document Reviewed: 06/02/2011 [facility] Patient Information 2015 [place], [place]. This information is not intended to replace advice given to you by your health care provider. Make sure you discuss any questions you have with your health care provider.

## 2014-09-02 NOTE — ED Notes (Signed)
Patient with bug bite on right elbow.  Patient states that it happened last night.  Patient has area of swelling, warmth and redness on right elbow.  No drainage noted.

## 2014-09-05 ENCOUNTER — Encounter (HOSPITAL_COMMUNITY): Payer: Self-pay | Admitting: Physical Medicine and Rehabilitation

## 2014-09-05 ENCOUNTER — Emergency Department (HOSPITAL_COMMUNITY)
Admission: EM | Admit: 2014-09-05 | Discharge: 2014-09-05 | Disposition: A | Discharge status: Home / Self Care | Payer: Medicare Other | Attending: Emergency Medicine | Admitting: Emergency Medicine

## 2014-09-05 DIAGNOSIS — Y999 Unspecified external cause status: Secondary | ICD-10-CM | POA: Insufficient documentation

## 2014-09-05 DIAGNOSIS — Z79899 Other long term (current) drug therapy: Secondary | ICD-10-CM | POA: Insufficient documentation

## 2014-09-05 DIAGNOSIS — Y929 Unspecified place or not applicable: Secondary | ICD-10-CM | POA: Diagnosis not present

## 2014-09-05 DIAGNOSIS — S80861A Insect bite (nonvenomous), right lower leg, initial encounter: Secondary | ICD-10-CM | POA: Insufficient documentation

## 2014-09-05 DIAGNOSIS — Z87891 Personal history of nicotine dependence: Secondary | ICD-10-CM | POA: Diagnosis not present

## 2014-09-05 DIAGNOSIS — I2510 Atherosclerotic heart disease of native coronary artery without angina pectoris: Secondary | ICD-10-CM | POA: Insufficient documentation

## 2014-09-05 DIAGNOSIS — Y939 Activity, unspecified: Secondary | ICD-10-CM | POA: Insufficient documentation

## 2014-09-05 DIAGNOSIS — W57XXXA Bitten or stung by nonvenomous insect and other nonvenomous arthropods, initial encounter: Secondary | ICD-10-CM | POA: Insufficient documentation

## 2014-09-05 DIAGNOSIS — S40261A Insect bite (nonvenomous) of right shoulder, initial encounter: Secondary | ICD-10-CM | POA: Insufficient documentation

## 2014-09-05 DIAGNOSIS — I10 Essential (primary) hypertension: Secondary | ICD-10-CM | POA: Insufficient documentation

## 2014-09-05 DIAGNOSIS — M1990 Unspecified osteoarthritis, unspecified site: Secondary | ICD-10-CM | POA: Insufficient documentation

## 2014-09-05 DIAGNOSIS — S50361A Insect bite (nonvenomous) of right elbow, initial encounter: Secondary | ICD-10-CM | POA: Insufficient documentation

## 2014-09-05 DIAGNOSIS — Z8542 Personal history of malignant neoplasm of other parts of uterus: Secondary | ICD-10-CM | POA: Diagnosis not present

## 2014-09-05 NOTE — Discharge Instructions (Signed)
Please follow up with your primary care physician in 1-2 days. If you do not have one please call the [facility] number listed above. Please continue your course of antibiotics. Please read all discharge instructions and return precautions.    Bedbugs Bedbugs are tiny bugs that live in and around beds. During the day, they hide in mattresses and other places near beds. They come out at night and bite people lying in bed. They need blood to live and grow. Bedbugs can be found in beds anywhere. Usually, they are found in places where many people come and go (hotels, shelters, hospitals). It does not matter whether the place is dirty or clean. Getting bitten by bedbugs rarely causes a medical problem. The biggest problem can be getting rid of them. This often takes the work of a Financial risk analyst. CAUSES  Less use of pesticides. Bedbugs were common before the 1950s. Then, strong pesticides such as DDT nearly wiped them out. Today, these pesticides are not used because they harm the environment and can cause health problems.  More travel. Besides mattresses, bedbugs can also live in clothing and luggage. They can come along as people travel from place to place. Bedbugs are more common in certain parts of the world. When people travel to those areas, the bugs can come home with them.  Presence of birds and bats. Bedbugs often infest birds and bats. If you have these animals in or near your home, bedbugs may infest your house, too. SYMPTOMS It does not hurt to be bitten by a bedbug. You will probably not wake up when you are bitten. Bedbugs usually bite areas of the skin that are not covered. Symptoms may show when you wake up, or they may take a day or more to show up. Symptoms may include:  Small red bumps on the skin. These might be lined up in a row or clustered in a group.  A darker red dot in the middle of red bumps.  Blisters on the skin. There may be swelling and very  bad itching. These may be signs of an allergic reaction. This does not happen often. DIAGNOSIS Bedbug bites might look and feel like other types of insect bites. The bugs do not stay on the body like ticks or lice. They bite, drop off, and crawl away to hide. Your caregiver will probably:  Ask about your symptoms.  Ask about your recent activities and travel.  Check your skin for bedbug bites.  Ask you to check at home for signs of bedbugs. You should look for:  Spots or stains on the bed or nearby. This could be from bedbugs that were crushed or from their eggs or waste.  Bedbugs themselves. They are reddish-brown, oval, and flat. They do not fly. They are about the size of an apple seed.  Places to look for bedbugs include:  Beds. Check mattresses, headboards, box springs, and bed frames.  On drapes and curtains near the bed.  Under carpeting in the bedroom.  Behind electrical outlets.  Behind any wallpaper that is peeling.  Inside luggage. TREATMENT Most bedbug bites do not need treatment. They usually go away on their own in a few days. The bites are not dangerous. However, treatment may be needed if you have scratched so much that your skin has become infected. You may also need treatment if you are allergic to bedbug bites. Treatment options include:  A drug that stops swelling and itching (corticosteroid). Usually,  a cream is rubbed on the skin. If you have a bad rash, you may be given a corticosteroid pill.  Oral antihistamines. These are pills to help control itching.  Antibiotic medicines. An antibiotic may be prescribed for infected skin. HOME CARE INSTRUCTIONS   Take any medicine prescribed by your caregiver for your bites. Follow the directions carefully.  Consider wearing pajamas with long sleeves and pant legs.  Your bedroom may need to be treated. A pest control expert should make sure the bedbugs are gone. You may need to throw away mattresses or luggage.  Ask the pest control expert what you can do to keep the bedbugs from coming back. Common suggestions include:  Putting a plastic cover over your mattress.  Washing and drying your clothes and bedding in hot water and a hot dryer. The temperature should be hotter than 120 F (48.9 C). Bedbugs are killed by high temperatures.  Vacuuming carefully all around your bed. Vacuum in all cracks and crevices where the bugs might hide. Do this often.  Carefully checking all used furniture, bedding, or clothes that you bring into your house.  Eliminating bird nests and bat roosts.  If you get bedbug bites when traveling, check all your possessions carefully before bringing them into your house. If you find any bugs on clothes or in your luggage, consider throwing those items away. SEEK MEDICAL CARE IF:  You have red bug bites that keep coming back.  You have red bug bites that itch badly.  You have bug bites that cause a skin rash.  You have scratch marks that are red and sore. SEEK IMMEDIATE MEDICAL CARE IF: You have a fever. Document Released: 04/19/2010 Document Revised: 06/09/2011 Document Reviewed: 04/19/2010 [facility] Patient Information 2015 [place], [place]. This information is not intended to replace advice given to you by your health care provider. Make sure you discuss any questions you have with your health care provider.

## 2014-09-05 NOTE — ED Provider Notes (Signed)
CSN: [id number]     Arrival date & time 09/05/14  1052 History  This chart was scribed for non-physician practitioner Baron Sane, PA-C working with Quintella Reichert, MD by Meriel Pica, ED Scribe. This patient was seen in room TR09C/TR09C and the patient's care was started at 12:07 PM.  Chief Complaint  Patient presents with  . Insect Bite   The history is provided by the patient. No language interpreter was used.    HPI Comments: Haley Long is a 64 y.o. female who presents to the Emergency Department for 48 hour insect bite follow up. Pt was seen in the ED on 6/4 complaining of bed bug bites to her right elbow and shoulder. She reports the wounds and swelling have gradually improved on her right arm. She has been compliant with antibiotic course of treatment and applied hydrocortisone cream accordingly. Pt is having her house sprayed for bed bugs today 6/7. She denies vomiting, nausea, diarrhea, abd pain, back pain, fevers, or chills.   Past Medical History  Diagnosis Date  . Hypertension   . Coronary artery disease   . Arthritis   . Irregular heart beat   . Varicose veins   . Cancer     uterine   Past Surgical History  Procedure Laterality Date  . Pelvic exenteration    . Abdominal hysterectomy    . Cholecystectomy    . Cataract extraction, bilateral    . Endovenous ablation saphenous vein w/ laser Left 04-21-2013    left greater saphenous vein and sclerotherapy left leg   Family History  Problem Relation Age of Onset  . Hyperlipidemia Mother   . Hypertension Mother   . Other Mother     varicose veins  . Diabetes Father   . Hyperlipidemia Father   . Hypertension Father   . Peripheral vascular disease Father   . Diabetes Sister   . Hyperlipidemia Sister   . Hypertension Sister   . Other Sister     varicose veins  . Hyperlipidemia Brother   . Hypertension Brother   . Peripheral vascular disease Brother   . Heart disease Daughter     before age 20    History  Substance Use Topics  . Smoking status: Former Smoker    Types: Cigarettes    Quit date: 03/31/1969  . Smokeless tobacco: Current User  . Alcohol Use: Yes   OB History    No data available     Review of Systems  Constitutional: Negative for fever and chills.  Gastrointestinal: Negative for nausea, vomiting, abdominal pain and diarrhea.  Musculoskeletal: Negative for back pain.  All other systems reviewed and are negative.  Allergies  Clonazepam  Home Medications   Prior to Admission medications   Medication Sig Start Date End Date Taking? Authorizing Provider  Calcium Carb-Cholecalciferol 500-600 MG-UNIT CHEW Chew 1 tablet by mouth 2 (two) times daily.    Historical Provider, MD  cephALEXin (KEFLEX) 500 MG capsule Take 1 capsule (500 mg total) by mouth 4 (four) times daily. 09/02/14   Dahlia Bailiff, PA-C  cetirizine (ZYRTEC) 10 MG tablet Take 10 mg by mouth daily.    Historical Provider, MD  cholecalciferol (VITAMIN D) 1000 UNITS tablet Take 1,000 Units by mouth daily.    Historical Provider, MD  clonazePAM (KLONOPIN) 0.5 MG tablet Take 0.25 mg by mouth 2 (two) times daily.    Historical Provider, MD  doxycycline (VIBRAMYCIN) 100 MG capsule Take 1 capsule (100 mg total) by mouth 2 (two) times  daily. One po bid x 7 days 08/19/14   Clayton Bibles, PA-C  ferrous sulfate 325 (65 FE) MG tablet Take 325 mg by mouth daily.    Historical Provider, MD  hydrochlorothiazide (HYDRODIURIL) 50 MG tablet Take 50 mg by mouth daily.    Historical Provider, MD  HYDROcodone-acetaminophen (VICODIN) 5-500 MG per tablet Take 1 tablet by mouth every 4 (four) hours as needed. For pain    Historical Provider, MD  hydrocortisone cream 1 % Apply to affected area 2 times daily 09/02/14   Dahlia Bailiff, PA-C  losartan (COZAAR) 50 MG tablet Take 50 mg by mouth daily.    Historical Provider, MD  Multiple Vitamin (MULITIVITAMIN WITH MINERALS) TABS Take 1 tablet by mouth daily.    Historical Provider, MD  omeprazole  (PRILOSEC) 20 MG capsule Take 20 mg by mouth daily.    Historical Provider, MD  potassium chloride SA (K-DUR,KLOR-CON) 20 MEQ tablet Take 20 mEq by mouth daily.    Historical Provider, MD  pregabalin (LYRICA) 50 MG capsule Take 50 mg by mouth 3 (three) times daily.    Historical Provider, MD   BP 155/80 mmHg  Pulse 66  Temp(Src) 97.8 F (36.6 C) (Oral)  Resp 18  SpO2 99%  Physical Exam  Constitutional: She is oriented to person, place, and time. She appears well-developed and well-nourished. No distress.  HENT:  Head: Normocephalic and atraumatic.  Right Ear: External ear normal.  Left Ear: External ear normal.  Nose: Nose normal.  Mouth/Throat: Oropharynx is clear and moist.  Eyes: Conjunctivae are normal.  Neck: Normal range of motion. Neck supple.  No nuchal rigidity.   Cardiovascular: Normal rate, regular rhythm and normal heart sounds.   Pulmonary/Chest: Effort normal and breath sounds normal.  Abdominal: Soft.  Musculoskeletal: Normal range of motion.  Neurological: She is alert and oriented to person, place, and time.  Skin: Skin is warm and dry. She is not diaphoretic.     Psychiatric: She has a normal mood and affect.  Nursing note and vitals reviewed.   ED Course  Procedures  DIAGNOSTIC STUDIES: Oxygen Saturation is 99% on RA, normal by my interpretation.    COORDINATION OF CARE: 12:07 PM Discussed treatment plan which includes to continue compliance with antibiotic course and apply hydrocortisone cream 1% as needed.  Pt acknowledges and agrees to plan.   Labs Review Labs Reviewed - No data to display  Imaging Review No results found.   EKG Interpretation None      MDM   Final diagnoses:  Insect bite    Filed Vitals:   09/05/14 1126  BP: 155/80  Pulse: 66  Temp: 97.8 F (36.6 C)  Resp: 18   Afebrile, NAD, non-toxic appearing, AAOx4.   Patient here for recheck for bug bites from 2 days ago. Rash is improved per patient and from evaluation  of previous provider note. Patient with 4 small spots without surrounding evidence of cellulitis. No evidence of abscess. Patient is due to have her house exterminated and the next day. Symptomatically measures discussed. Advised patient finish her course of antibiotics. Advised PCP follow-up. Return precautions discussed. Patient is agreeable to plan. Patient is stable at time of discharge    I personally performed the services described in this documentation, which was scribed in my presence. The recorded information has been reviewed and is accurate.     Baron Sane, PA-C 09/05/14 [place], MD 09/05/14 1409

## 2014-09-05 NOTE — ED Notes (Signed)
Declined W/C at D/C and was escorted to lobby by RN. 

## 2014-09-05 NOTE — ED Notes (Signed)
Pt   Seen   Er    3  Days  Ago  For  Bedbug  Bites        Pt  States  The  Bites   Are  Doing better         She  Does  However   Have  One     New  Bite  On the  l   Leg

## 2014-09-05 NOTE — ED Notes (Signed)
Pt presents to department for follow up. Reports she had several bed bug bites last week. Pt states she feels better today but still has several bites on arms. NAD

## 2015-03-29 ENCOUNTER — Encounter (HOSPITAL_COMMUNITY): Payer: Self-pay | Admitting: Cardiology

## 2015-03-29 ENCOUNTER — Emergency Department (HOSPITAL_COMMUNITY)
Admission: EM | Admit: 2015-03-29 | Discharge: 2015-03-30 | Disposition: A | Discharge status: Home / Self Care | Payer: Medicare Other | Attending: Physician Assistant | Admitting: Physician Assistant

## 2015-03-29 ENCOUNTER — Emergency Department (HOSPITAL_COMMUNITY): Payer: Medicare Other

## 2015-03-29 DIAGNOSIS — I2510 Atherosclerotic heart disease of native coronary artery without angina pectoris: Secondary | ICD-10-CM | POA: Diagnosis not present

## 2015-03-29 DIAGNOSIS — I10 Essential (primary) hypertension: Secondary | ICD-10-CM | POA: Insufficient documentation

## 2015-03-29 DIAGNOSIS — Z859 Personal history of malignant neoplasm, unspecified: Secondary | ICD-10-CM | POA: Diagnosis not present

## 2015-03-29 DIAGNOSIS — Z87891 Personal history of nicotine dependence: Secondary | ICD-10-CM | POA: Diagnosis not present

## 2015-03-29 DIAGNOSIS — M1990 Unspecified osteoarthritis, unspecified site: Secondary | ICD-10-CM | POA: Diagnosis not present

## 2015-03-29 DIAGNOSIS — L03114 Cellulitis of left upper limb: Secondary | ICD-10-CM | POA: Diagnosis not present

## 2015-03-29 DIAGNOSIS — Z79899 Other long term (current) drug therapy: Secondary | ICD-10-CM | POA: Insufficient documentation

## 2015-03-29 DIAGNOSIS — R079 Chest pain, unspecified: Secondary | ICD-10-CM | POA: Diagnosis present

## 2015-03-29 LAB — BASIC METABOLIC PANEL
Anion gap: 9 (ref 5–15)
BUN: 13 mg/dL (ref 6–20)
CALCIUM: 9.5 mg/dL (ref 8.9–10.3)
CO2: 28 mmol/L (ref 22–32)
CREATININE: 0.63 mg/dL (ref 0.44–1.00)
Chloride: 98 mmol/L — ABNORMAL LOW (ref 101–111)
GFR calc Af Amer: >60 mL/min (ref >60)
GLUCOSE: 97 mg/dL (ref 65–99)
Potassium: 3.2 mmol/L — ABNORMAL LOW (ref 3.5–5.1)
Sodium: 135 mmol/L (ref 135–145)

## 2015-03-29 LAB — CBC
HCT: 34.7 % — ABNORMAL LOW (ref 36.0–46.0)
Hemoglobin: 11.7 g/dL — ABNORMAL LOW (ref 12.0–15.0)
MCH: 29.3 pg (ref 26.0–34.0)
MCHC: 33.7 g/dL (ref 30.0–36.0)
MCV: 87 fL (ref 78.0–100.0)
PLATELETS: 251 10*3/uL (ref 150–400)
RBC: 3.99 MIL/uL (ref 3.87–5.11)
RDW: 12.7 % (ref 11.5–15.5)
WBC: 7 10*3/uL (ref 4.0–10.5)

## 2015-03-29 LAB — I-STAT TROPONIN, ED: TROPONIN I, POC: 0 ng/mL (ref 0.00–0.08)

## 2015-03-29 MED ORDER — CLINDAMYCIN HCL 150 MG PO CAPS: 300.0000 mg | ORAL_CAPSULE | Freq: Three times a day (TID) | ORAL | Status: DC

## 2015-03-29 MED ORDER — CLINDAMYCIN HCL 150 MG PO CAPS
300.0000 mg | ORAL_CAPSULE | Freq: Once | ORAL | Status: AC
  Administered 2015-03-29: 300 mg via ORAL
  Filled 2015-03-29: qty 2

## 2015-03-29 NOTE — ED Provider Notes (Signed)
CSN: [id number]     Arrival date & time 03/29/15  1738 History   First MD Initiated Contact with Patient 03/29/15 2304     Chief Complaint  Patient presents with  . Chest Pain  . Insect Bite     (Consider location/radiation/quality/duration/timing/severity/associated sxs/prior Treatment) HPI    Patient is a 64 year old female presenting with an insect bite to her left hand. Patient reports that she was carrying her clothing up from the basement of her building where she was doing her laundry. She felt a bite to the  back of her neck and then a bite to her left hand. She reports that she caught the bug and it was a bedbug. She reports that she's had bedbugs in her building before. Patient reports redness to left hand. When she saw this she had immediate chest pain. It since resolved after she got over the shock of the bug. This happened prior to her arrival, 6 hours ago.  Past Medical History  Diagnosis Date  . Hypertension   . Coronary artery disease   . Arthritis   . Irregular heart beat   . Varicose veins   . Cancer [facility])     uterine   Past Surgical History  Procedure Laterality Date  . Pelvic exenteration    . Abdominal hysterectomy    . Cholecystectomy    . Cataract extraction, bilateral    . Endovenous ablation saphenous vein w/ laser Left 04-21-2013    left greater saphenous vein and sclerotherapy left leg   Family History  Problem Relation Age of Onset  . Hyperlipidemia Mother   . Hypertension Mother   . Other Mother     varicose veins  . Diabetes Father   . Hyperlipidemia Father   . Hypertension Father   . Peripheral vascular disease Father   . Diabetes Sister   . Hyperlipidemia Sister   . Hypertension Sister   . Other Sister     varicose veins  . Hyperlipidemia Brother   . Hypertension Brother   . Peripheral vascular disease Brother   . Heart disease Daughter     before age 9   Social History  Substance Use Topics  . Smoking status: Former Smoker     Types: Cigarettes    Quit date: 03/31/1969  . Smokeless tobacco: Current User  . Alcohol Use: Yes   OB History    No data available     Review of Systems  Constitutional: Negative for activity change.  Respiratory: Negative for shortness of breath.   Cardiovascular: Positive for chest pain.  Gastrointestinal: Negative for abdominal pain.  Genitourinary: Negative for dysuria.  Neurological: Negative for dizziness.  Psychiatric/Behavioral: Negative for agitation.      Allergies  Clonazepam  Home Medications   Prior to Admission medications   Medication Sig Start Date End Date Taking? Authorizing Provider  Calcium Carb-Cholecalciferol 500-600 MG-UNIT CHEW Chew 1 tablet by mouth 2 (two) times daily.    Historical Provider, MD  cephALEXin (KEFLEX) 500 MG capsule Take 1 capsule (500 mg total) by mouth 4 (four) times daily. 09/02/14   Dahlia Bailiff, PA-C  cetirizine (ZYRTEC) 10 MG tablet Take 10 mg by mouth daily.    Historical Provider, MD  cholecalciferol (VITAMIN D) 1000 UNITS tablet Take 1,000 Units by mouth daily.    Historical Provider, MD  clonazePAM (KLONOPIN) 0.5 MG tablet Take 0.25 mg by mouth 2 (two) times daily.    Historical Provider, MD  doxycycline (VIBRAMYCIN) 100 MG  capsule Take 1 capsule (100 mg total) by mouth 2 (two) times daily. One po bid x 7 days 08/19/14   Clayton Bibles, PA-C  ferrous sulfate 325 (65 FE) MG tablet Take 325 mg by mouth daily.    Historical Provider, MD  hydrochlorothiazide (HYDRODIURIL) 50 MG tablet Take 50 mg by mouth daily.    Historical Provider, MD  HYDROcodone-acetaminophen (VICODIN) 5-500 MG per tablet Take 1 tablet by mouth every 4 (four) hours as needed. For pain    Historical Provider, MD  hydrocortisone cream 1 % Apply to affected area 2 times daily 09/02/14   Dahlia Bailiff, PA-C  losartan (COZAAR) 50 MG tablet Take 50 mg by mouth daily.    Historical Provider, MD  Multiple Vitamin (MULITIVITAMIN WITH MINERALS) TABS Take 1 tablet by mouth daily.     Historical Provider, MD  omeprazole (PRILOSEC) 20 MG capsule Take 20 mg by mouth daily.    Historical Provider, MD  potassium chloride SA (K-DUR,KLOR-CON) 20 MEQ tablet Take 20 mEq by mouth daily.    Historical Provider, MD  pregabalin (LYRICA) 50 MG capsule Take 50 mg by mouth 3 (three) times daily.    Historical Provider, MD   BP 146/61 mmHg  Pulse 58  Temp(Src) 97.8 F (36.6 C) (Oral)  Resp 15  SpO2 100% Physical Exam  Constitutional: She is oriented to person, place, and time. She appears well-developed and well-nourished.  HENT:  Head: Normocephalic and atraumatic.  Eyes: Conjunctivae are normal. Right eye exhibits no discharge.  Neck: Neck supple.  Cardiovascular: Normal rate, regular rhythm and normal heart sounds.   No murmur heard. Pulmonary/Chest: Effort normal and breath sounds normal. She has no wheezes. She has no rales.  Abdominal: Soft. She exhibits no distension. There is no tenderness.  Musculoskeletal: Normal range of motion. She exhibits no edema.  Neurological: She is oriented to person, place, and time. No cranial nerve deficit.  Skin: Skin is warm and dry. No rash noted. She is not diaphoretic.  L hand with light erythema to dorsal surface, bite mark present, < 1 mm  Psychiatric: She has a normal mood and affect. Her behavior is normal.  Nursing note and vitals reviewed.   ED Course  Procedures (including critical care time) Labs Review Labs Reviewed  BASIC METABOLIC PANEL - Abnormal; Notable for the following:    Potassium 3.2 (*)    Chloride 98 (*)    All other components within normal limits  CBC - Abnormal; Notable for the following:    Hemoglobin 11.7 (*)    HCT 34.7 (*)    All other components within normal limits  I-STAT TROPOININ, ED  Randolm Idol, ED    Imaging Review Dg Chest 2 View  03/29/2015  CLINICAL DATA:  Chest pain 1 day. EXAM: CHEST  2 VIEW COMPARISON:  06/10/2011 FINDINGS: Lungs are adequately inflated without consolidation  or effusion. Cardiomediastinal silhouette is within normal. There is mild calcified plaque over the aortic arch. Remainder of the exam is unchanged. IMPRESSION: No active cardiopulmonary disease. Electronically Signed   By: Marin Olp M.D.   On: 03/29/2015 19:54   I have personally reviewed and evaluated these images and lab results as part of my medical decision-making.   EKG Interpretation   Date/Time:  Thursday March 29 2015 18:17:12 EST Ventricular Rate:  69 PR Interval:  188 QRS Duration: 86 QT Interval:  404 QTC Calculation: 432 R Axis:   39 Text Interpretation:  Normal sinus rhythm Junctional ST depression,  probably normal Borderline ECG No significant change since last tracing no  acute ischemia Confirmed by Gerald Leitz ([id number]) on 03/29/2015  11:31:27 PM      MDM   Final diagnoses:  None    Patient is a 34 her old female presenting with bedbug bite to her left hand. She's had these previously. She has mild erythema to the hand. She also reports chest pain at the time of the bug bite. Given this is been 6 hours we will get single i-STAT troponin now will be sufficient to rule out cardiac ischemia. Does not sound ischemic in nature. We will give antibiotics for a developing potential cellulitis of her left hand.    Courteney Julio Alm, MD 03/29/15 [phone]

## 2015-03-29 NOTE — Discharge Instructions (Signed)
Please return if the redness gets worse or you have any additional chest pain.l   Cellulitis Cellulitis is an infection of the skin and the tissue beneath it. The infected area is usually red and tender. Cellulitis occurs most often in the arms and lower legs.  CAUSES  Cellulitis is caused by bacteria that enter the skin through cracks or cuts in the skin. The most common types of bacteria that cause cellulitis are staphylococci and streptococci. SIGNS AND SYMPTOMS   Redness and warmth.  Swelling.  Tenderness or pain.  Fever. DIAGNOSIS  Your health care provider can usually determine what is wrong based on a physical exam. Blood tests may also be done. TREATMENT  Treatment usually involves taking an antibiotic medicine. HOME CARE INSTRUCTIONS   Take your antibiotic medicine as directed by your health care provider. Finish the antibiotic even if you start to feel better.  Keep the infected arm or leg elevated to reduce swelling.  Apply a warm cloth to the affected area up to 4 times per day to relieve pain.  Take medicines only as directed by your health care provider.  Keep all follow-up visits as directed by your health care provider. SEEK MEDICAL CARE IF:   You notice red streaks coming from the infected area.  Your red area gets larger or turns dark in color.  Your bone or joint underneath the infected area becomes painful after the skin has healed.  Your infection returns in the same area or another area.  You notice a swollen bump in the infected area.  You develop new symptoms.  You have a fever. SEEK IMMEDIATE MEDICAL CARE IF:   You feel very sleepy.  You develop vomiting or diarrhea.  You have a general ill feeling (malaise) with muscle aches and pains.   This information is not intended to replace advice given to you by your health care provider. Make sure you discuss any questions you have with your health care provider.   Document Released:  12/25/2004 Document Revised: 12/06/2014 Document Reviewed: 06/02/2011 Elsevier Interactive Patient Education [employer].

## 2015-03-29 NOTE — ED Notes (Signed)
Pt reports she thinks she was bite on the left hand by a bed bug and it's swollen. Also reports chest pain today.

## 2015-03-30 LAB — I-STAT TROPONIN, ED: Troponin i, poc: 0 ng/mL (ref 0.00–0.08)

## 2016-01-11 ENCOUNTER — Emergency Department (HOSPITAL_COMMUNITY)
Admission: EM | Admit: 2016-01-11 | Discharge: 2016-01-11 | Disposition: A | Discharge status: Home / Self Care | Payer: Medicare Other | Attending: Emergency Medicine | Admitting: Emergency Medicine

## 2016-01-11 ENCOUNTER — Encounter (HOSPITAL_COMMUNITY): Payer: Self-pay | Admitting: *Deleted

## 2016-01-11 DIAGNOSIS — Z8541 Personal history of malignant neoplasm of cervix uteri: Secondary | ICD-10-CM | POA: Insufficient documentation

## 2016-01-11 DIAGNOSIS — Y999 Unspecified external cause status: Secondary | ICD-10-CM | POA: Insufficient documentation

## 2016-01-11 DIAGNOSIS — S80862A Insect bite (nonvenomous), left lower leg, initial encounter: Secondary | ICD-10-CM | POA: Diagnosis present

## 2016-01-11 DIAGNOSIS — L03116 Cellulitis of left lower limb: Secondary | ICD-10-CM | POA: Insufficient documentation

## 2016-01-11 DIAGNOSIS — Z87891 Personal history of nicotine dependence: Secondary | ICD-10-CM | POA: Insufficient documentation

## 2016-01-11 DIAGNOSIS — W57XXXA Bitten or stung by nonvenomous insect and other nonvenomous arthropods, initial encounter: Secondary | ICD-10-CM | POA: Diagnosis not present

## 2016-01-11 DIAGNOSIS — Y929 Unspecified place or not applicable: Secondary | ICD-10-CM | POA: Diagnosis not present

## 2016-01-11 DIAGNOSIS — I10 Essential (primary) hypertension: Secondary | ICD-10-CM | POA: Diagnosis not present

## 2016-01-11 DIAGNOSIS — I2510 Atherosclerotic heart disease of native coronary artery without angina pectoris: Secondary | ICD-10-CM | POA: Diagnosis not present

## 2016-01-11 DIAGNOSIS — Y939 Activity, unspecified: Secondary | ICD-10-CM | POA: Insufficient documentation

## 2016-01-11 MED ORDER — CLINDAMYCIN HCL 150 MG PO CAPS: 450.0000 mg | ORAL_CAPSULE | Freq: Three times a day (TID) | ORAL | 0 refills | Status: DC

## 2016-01-11 MED ORDER — CLINDAMYCIN HCL 150 MG PO CAPS
600.0000 mg | ORAL_CAPSULE | Freq: Once | ORAL | Status: AC
  Administered 2016-01-11: 600 mg via ORAL
  Filled 2016-01-11: qty 4

## 2016-01-11 NOTE — ED Provider Notes (Signed)
[place] DEPT Provider Note   CSN: [id number] Arrival date & time: 01/11/16  1826  By signing my name below, I, Rayna Sexton, attest that this documentation has been prepared under the direction and in the presence of [employer], PA-C. Electronically Signed: Rayna Sexton, ED Scribe. 01/11/16. 8:00 PM.   History   Chief Complaint Chief Complaint  Patient presents with  . Insect Bite    HPI HPI Comments: Haley Long is a 65 y.o. female who presents to the Emergency Department complaining of worsening, moderate, rash to her left lower leg x 2 days. She states she was "bit by a flea" and that "she is very allergic to fleas". She has applied rubbing alcohol and antibiotic ointment to the region w/o significant relief. She reports associated redness and swelling across the affected region. Pt states she has been told she is "borderline diabetic". She denies a h/o DVT/PE. Pt confirms her listed PMHx and additionally notes having significant nerve damage and arthritis in her BLEs. She is not currently on any type of chemotherapy or radiation therapy. Pt denies fevers, chills or other associated symptoms at this time.   The history is provided by the patient and medical records. No language interpreter was used.   Past Medical History:  Diagnosis Date  . Arthritis   . Cancer ([place])    uterine  . Coronary artery disease   . Hypertension   . Irregular heart beat   . Varicose veins     Patient Active Problem List   Diagnosis Date Noted  . Varicose veins of lower extremities with other complications [id number]  . Irregular heart beat     Past Surgical History:  Procedure Laterality Date  . ABDOMINAL HYSTERECTOMY    . CATARACT EXTRACTION, BILATERAL    . CHOLECYSTECTOMY    . ENDOVENOUS ABLATION SAPHENOUS VEIN W/ LASER Left 04-21-2013   left greater saphenous vein and sclerotherapy left leg  . PELVIC EXENTERATION      OB History    No data available        Home Medications    Prior to Admission medications   Medication Sig Start Date End Date Taking? Authorizing Provider  Calcium Carb-Cholecalciferol 500-600 MG-UNIT CHEW Chew 1 tablet by mouth 2 (two) times daily.    Historical Provider, MD  cephALEXin (KEFLEX) 500 MG capsule Take 1 capsule (500 mg total) by mouth 4 (four) times daily. 09/02/14   Dahlia Bailiff, PA-C  cetirizine (ZYRTEC) 10 MG tablet Take 10 mg by mouth daily.    Historical Provider, MD  cholecalciferol (VITAMIN D) 1000 UNITS tablet Take 1,000 Units by mouth daily.    Historical Provider, MD  clindamycin (CLEOCIN) 150 MG capsule Take 3 capsules (450 mg total) by mouth 3 (three) times daily. 01/11/16   Vannary Greening, PA-C  clonazePAM (KLONOPIN) 0.5 MG tablet Take 0.25 mg by mouth 2 (two) times daily.    Historical Provider, MD  doxycycline (VIBRAMYCIN) 100 MG capsule Take 1 capsule (100 mg total) by mouth 2 (two) times daily. One po bid x 7 days 08/19/14   Clayton Bibles, PA-C  ferrous sulfate 325 (65 FE) MG tablet Take 325 mg by mouth daily.    Historical Provider, MD  hydrochlorothiazide (HYDRODIURIL) 50 MG tablet Take 50 mg by mouth daily.    Historical Provider, MD  HYDROcodone-acetaminophen (VICODIN) 5-500 MG per tablet Take 1 tablet by mouth every 4 (four) hours as needed. For pain    Historical Provider, MD  hydrocortisone cream 1 %  Apply to affected area 2 times daily 09/02/14   Dahlia Bailiff, PA-C  losartan (COZAAR) 50 MG tablet Take 50 mg by mouth daily.    Historical Provider, MD  Multiple Vitamin (MULITIVITAMIN WITH MINERALS) TABS Take 1 tablet by mouth daily.    Historical Provider, MD  omeprazole (PRILOSEC) 20 MG capsule Take 20 mg by mouth daily.    Historical Provider, MD  potassium chloride SA (K-DUR,KLOR-CON) 20 MEQ tablet Take 20 mEq by mouth daily.    Historical Provider, MD  pregabalin (LYRICA) 50 MG capsule Take 50 mg by mouth 3 (three) times daily.    Historical Provider, MD    Family History Family History   Problem Relation Age of Onset  . Hyperlipidemia Mother   . Hypertension Mother   . Other Mother     varicose veins  . Diabetes Father   . Hyperlipidemia Father   . Hypertension Father   . Peripheral vascular disease Father   . Diabetes Sister   . Hyperlipidemia Sister   . Hypertension Sister   . Other Sister     varicose veins  . Hyperlipidemia Brother   . Hypertension Brother   . Peripheral vascular disease Brother   . Heart disease Daughter     before age 43    Social History Social History  Substance Use Topics  . Smoking status: Former Smoker    Types: Cigarettes    Quit date: 03/31/1969  . Smokeless tobacco: Current User  . Alcohol use Yes     Allergies   Clonazepam   Review of Systems Review of Systems  Constitutional: Negative for chills and fever.  Skin: Positive for color change and rash.  All other systems reviewed and are negative.   Physical Exam Updated Vital Signs BP 174/69 (BP Location: Right Arm)   Pulse 72   Temp 98.4 F (36.9 C) (Oral)   Resp 16   SpO2 100%   Physical Exam  Constitutional: She is oriented to person, place, and time. She appears well-developed and well-nourished. No distress.  HENT:  Head: Normocephalic and atraumatic.  Eyes: Conjunctivae are normal. No scleral icterus.  Neck: Normal range of motion.  Cardiovascular: Normal rate, regular rhythm and intact distal pulses.   Pulmonary/Chest: Effort normal and breath sounds normal.  Abdominal: Soft. She exhibits no distension. There is no tenderness.  Lymphadenopathy:    She has no cervical adenopathy.  Neurological: She is alert and oriented to person, place, and time.  Skin: Skin is warm and dry. She is not diaphoretic. There is erythema.  Large area of erythema and increased warmth to the anterior, medial and lateral sides of the left lower extremity without induration or fluctuance. Questionable insect bite at the center. 2+ pitting edema of the left lower extremity  extending into the ankle  Psychiatric: She has a normal mood and affect.  Nursing note and vitals reviewed.   ED Treatments / Results   Procedures Procedures  DIAGNOSTIC STUDIES: Oxygen Saturation is 100% on RA, normal by my interpretation.    COORDINATION OF CARE: 7:59 PM Discussed next steps with pt. Pt verbalized understanding and is agreeable with the plan.    Medications Ordered in ED Medications  clindamycin (CLEOCIN) capsule 600 mg (600 mg Oral Given 01/11/16 2026)     Initial Impression / Assessment and Plan / ED Course  I have reviewed the triage vital signs and the nursing notes.  Pertinent labs & imaging results that were available during my care of the  patient were reviewed by me and considered in my medical decision making (see chart for details).  Clinical Course  Comment By Time  Pt evaluated by Dr. Laverta Baltimore who is in agreement with the plan. Abigail Butts, PA-C 10/13 2033   Patient presents with cellulitis of the left lower extremity. She is pre-diabetic. She is well appearing and her vital signs are stable. She has no clinical symptoms of systemic illness. Long discussion with patient about concern for cellulitis and no compromise. She does not wish to be admitted at this time. She is however willing to return in 24 hours for a recheck. I think this is reasonable. Cellulitis margins marked with pen. She was given a dose of clindamycin here in the emergency room and will be discharged home with same.  He assures me she will return in 24 hours.  Return precautions given if she should return sooner.  I personally performed the services described in this documentation, which was scribed in my presence. The recorded information has been reviewed and is accurate.'  Final Clinical Impressions(s) / ED Diagnoses   Final diagnoses:  Cellulitis of left lower extremity  Insect bite, initial encounter    New Prescriptions New Prescriptions   CLINDAMYCIN (CLEOCIN) 150  MG CAPSULE    Take 3 capsules (450 mg total) by mouth 3 (three) times daily.     Jarrett Soho Val Schiavo, PA-C 01/11/16 2037    Margette Fast, MD 01/12/16 [phone]

## 2016-01-11 NOTE — ED Notes (Signed)
Pt stable, discharge instructions reviewed, pt states understanding of reason for return visit.

## 2016-01-11 NOTE — ED Triage Notes (Signed)
Pt c/o being bit by fleas.

## 2016-01-11 NOTE — Discharge Instructions (Signed)
1. Medications: Clindamycin, usual home medications 2. Treatment: rest, drink plenty of fluids,  3. Follow Up: Please turn to the emergency room tomorrow for wound check.  Please plan to see her doctor on Monday for further evaluation and continued following.

## 2016-03-20 ENCOUNTER — Emergency Department (HOSPITAL_COMMUNITY)
Admission: EM | Admit: 2016-03-20 | Discharge: 2016-03-20 | Disposition: A | Discharge status: Home / Self Care | Payer: Medicare Other | Attending: Emergency Medicine | Admitting: Emergency Medicine

## 2016-03-20 ENCOUNTER — Encounter (HOSPITAL_COMMUNITY): Payer: Self-pay

## 2016-03-20 DIAGNOSIS — R600 Localized edema: Secondary | ICD-10-CM | POA: Insufficient documentation

## 2016-03-20 DIAGNOSIS — Z8542 Personal history of malignant neoplasm of other parts of uterus: Secondary | ICD-10-CM | POA: Diagnosis not present

## 2016-03-20 DIAGNOSIS — I10 Essential (primary) hypertension: Secondary | ICD-10-CM | POA: Insufficient documentation

## 2016-03-20 DIAGNOSIS — Z87891 Personal history of nicotine dependence: Secondary | ICD-10-CM | POA: Insufficient documentation

## 2016-03-20 DIAGNOSIS — I2510 Atherosclerotic heart disease of native coronary artery without angina pectoris: Secondary | ICD-10-CM | POA: Insufficient documentation

## 2016-03-20 DIAGNOSIS — K047 Periapical abscess without sinus: Secondary | ICD-10-CM

## 2016-03-20 DIAGNOSIS — K0889 Other specified disorders of teeth and supporting structures: Secondary | ICD-10-CM | POA: Diagnosis present

## 2016-03-20 DIAGNOSIS — R609 Edema, unspecified: Secondary | ICD-10-CM

## 2016-03-20 LAB — CBC WITH DIFFERENTIAL/PLATELET
BASOS ABS: 0 10*3/uL (ref 0.0–0.1)
Basophils Relative: 1 %
EOS ABS: 0.3 10*3/uL (ref 0.0–0.7)
EOS PCT: 5 %
HCT: 32.8 % — ABNORMAL LOW (ref 36.0–46.0)
HEMOGLOBIN: 11 g/dL — AB (ref 12.0–15.0)
Lymphocytes Relative: 42 %
Lymphs Abs: 2.2 10*3/uL (ref 0.7–4.0)
MCH: 29.3 pg (ref 26.0–34.0)
MCHC: 33.5 g/dL (ref 30.0–36.0)
MCV: 87.5 fL (ref 78.0–100.0)
Monocytes Absolute: 0.6 10*3/uL (ref 0.1–1.0)
Monocytes Relative: 10 %
Neutro Abs: 2.3 10*3/uL (ref 1.7–7.7)
Neutrophils Relative %: 42 %
PLATELETS: 283 10*3/uL (ref 150–400)
RBC: 3.75 MIL/uL — ABNORMAL LOW (ref 3.87–5.11)
RDW: 12.9 % (ref 11.5–15.5)
WBC: 5.4 10*3/uL (ref 4.0–10.5)

## 2016-03-20 LAB — COMPREHENSIVE METABOLIC PANEL
ALT: 14 U/L (ref 14–54)
AST: 23 U/L (ref 15–41)
Albumin: 4.2 g/dL (ref 3.5–5.0)
Alkaline Phosphatase: 38 U/L (ref 38–126)
Anion gap: 7 (ref 5–15)
BILIRUBIN TOTAL: 0.4 mg/dL (ref 0.3–1.2)
BUN: 8 mg/dL (ref 6–20)
CHLORIDE: 93 mmol/L — AB (ref 101–111)
CO2: 29 mmol/L (ref 22–32)
CREATININE: 0.82 mg/dL (ref 0.44–1.00)
Calcium: 9.2 mg/dL (ref 8.9–10.3)
Glucose, Bld: 98 mg/dL (ref 65–99)
POTASSIUM: 3.8 mmol/L (ref 3.5–5.1)
Sodium: 129 mmol/L — ABNORMAL LOW (ref 135–145)
TOTAL PROTEIN: 6.8 g/dL (ref 6.5–8.1)

## 2016-03-20 MED ORDER — FUROSEMIDE 20 MG PO TABS: 20.0000 mg | ORAL_TABLET | Freq: Every day | ORAL | 0 refills | Status: DC

## 2016-03-20 MED ORDER — PENICILLIN V POTASSIUM 250 MG PO TABS
500.0000 mg | ORAL_TABLET | Freq: Once | ORAL | Status: AC
  Administered 2016-03-20: 500 mg via ORAL
  Filled 2016-03-20: qty 2

## 2016-03-20 MED ORDER — PENICILLIN V POTASSIUM 500 MG PO TABS: 500.0000 mg | ORAL_TABLET | Freq: Four times a day (QID) | ORAL | 0 refills | Status: DC

## 2016-03-20 MED ORDER — POTASSIUM CHLORIDE ER 10 MEQ PO TBCR: 10.0000 meq | EXTENDED_RELEASE_TABLET | Freq: Every day | ORAL | 0 refills | Status: DC

## 2016-03-20 MED ORDER — NAPROXEN 500 MG PO TABS: 500.0000 mg | ORAL_TABLET | Freq: Two times a day (BID) | ORAL | 0 refills | Status: DC

## 2016-03-20 MED ORDER — HYDROCODONE-ACETAMINOPHEN 5-325 MG PO TABS
1.0000 | ORAL_TABLET | Freq: Once | ORAL | Status: AC
  Administered 2016-03-20: 1 via ORAL
  Filled 2016-03-20: qty 1

## 2016-03-20 NOTE — Discharge Instructions (Signed)
You need to see a dentist and consider having this tooth extracted.  Penicillin is a temporary treatment for the infection. The tooth will need to be extracted at some point. Sooner rather than later.  Stop taking your current hydrochlorothiazide, and take Lasix for the next 10 days. You're being given a new prescription for Lasix as well as potassium to take daily.  Wear compression stockings and elevate your feet as much as possible

## 2016-03-20 NOTE — ED Provider Notes (Signed)
[facility] DEPT Provider Note   CSN: [id number] Arrival date & time: 03/20/16  1920     History   Chief Complaint Chief Complaint  Patient presents with  . Dental Pain  . Joint Swelling    HPI Haley Long is a 65 y.o. female. She complains of a painful tooth and continued swelling of her ankles.  She states she's had a "bad tooth for "a while". She states she thinks it "might be abscessed. She admits that most of her tooth has been missing for several years and has been painful for several months, but more painful the last 2-3 days.  He states she's had some swelling in her ankles for "a while". She thinks it may have been years. She's pressure she takes a water pill but does not know the name of it. "It usually helps". No change in amount of swelling. No asymmetry. No redness or pain. No shortness of breath. Still on her medication "I think".  HPI  Past Medical History:  Diagnosis Date  . Arthritis   . Cancer ([place])    uterine  . Coronary artery disease   . Hypertension   . Irregular heart beat   . Varicose veins     Patient Active Problem List   Diagnosis Date Noted  . Varicose veins of lower extremities with other complications [id number]  . Irregular heart beat     Past Surgical History:  Procedure Laterality Date  . ABDOMINAL HYSTERECTOMY    . CATARACT EXTRACTION, BILATERAL    . CHOLECYSTECTOMY    . ENDOVENOUS ABLATION SAPHENOUS VEIN W/ LASER Left 04-21-2013   left greater saphenous vein and sclerotherapy left leg  . PELVIC EXENTERATION      OB History    No data available       Home Medications    Prior to Admission medications   Medication Sig Start Date End Date Taking? Authorizing Provider  Calcium Carb-Cholecalciferol 500-600 MG-UNIT CHEW Chew 1 tablet by mouth 2 (two) times daily.    Historical Provider, MD  cephALEXin (KEFLEX) 500 MG capsule Take 1 capsule (500 mg total) by mouth 4 (four) times daily. 09/02/14   Dahlia Bailiff, PA-C    cetirizine (ZYRTEC) 10 MG tablet Take 10 mg by mouth daily.    Historical Provider, MD  cholecalciferol (VITAMIN D) 1000 UNITS tablet Take 1,000 Units by mouth daily.    Historical Provider, MD  clindamycin (CLEOCIN) 150 MG capsule Take 3 capsules (450 mg total) by mouth 3 (three) times daily. 01/11/16   Hannah Muthersbaugh, PA-C  clonazePAM (KLONOPIN) 0.5 MG tablet Take 0.25 mg by mouth 2 (two) times daily.    Historical Provider, MD  doxycycline (VIBRAMYCIN) 100 MG capsule Take 1 capsule (100 mg total) by mouth 2 (two) times daily. One po bid x 7 days 08/19/14   Clayton Bibles, PA-C  ferrous sulfate 325 (65 FE) MG tablet Take 325 mg by mouth daily.    Historical Provider, MD  furosemide (LASIX) 20 MG tablet Take 1 tablet (20 mg total) by mouth daily. 03/20/16   Tanna Furry, MD  hydrochlorothiazide (HYDRODIURIL) 50 MG tablet Take 50 mg by mouth daily.    Historical Provider, MD  HYDROcodone-acetaminophen (VICODIN) 5-500 MG per tablet Take 1 tablet by mouth every 4 (four) hours as needed. For pain    Historical Provider, MD  hydrocortisone cream 1 % Apply to affected area 2 times daily 09/02/14   Dahlia Bailiff, PA-C  losartan (COZAAR) 50 MG tablet Take 50  mg by mouth daily.    Historical Provider, MD  Multiple Vitamin (MULITIVITAMIN WITH MINERALS) TABS Take 1 tablet by mouth daily.    Historical Provider, MD  omeprazole (PRILOSEC) 20 MG capsule Take 20 mg by mouth daily.    Historical Provider, MD  penicillin v potassium (VEETID) 500 MG tablet Take 1 tablet (500 mg total) by mouth 4 (four) times daily. 03/20/16   Tanna Furry, MD  potassium chloride (K-DUR) 10 MEQ tablet Take 1 tablet (10 mEq total) by mouth daily. 03/20/16   Tanna Furry, MD  potassium chloride SA (K-DUR,KLOR-CON) 20 MEQ tablet Take 20 mEq by mouth daily.    Historical Provider, MD  pregabalin (LYRICA) 50 MG capsule Take 50 mg by mouth 3 (three) times daily.    Historical Provider, MD    Family History Family History  Problem Relation Age of  Onset  . Hyperlipidemia Mother   . Hypertension Mother   . Other Mother     varicose veins  . Diabetes Father   . Hyperlipidemia Father   . Hypertension Father   . Peripheral vascular disease Father   . Diabetes Sister   . Hyperlipidemia Sister   . Hypertension Sister   . Other Sister     varicose veins  . Hyperlipidemia Brother   . Hypertension Brother   . Peripheral vascular disease Brother   . Heart disease Daughter     before age 63    Social History Social History  Substance Use Topics  . Smoking status: Former Smoker    Types: Cigarettes    Quit date: 03/31/1969  . Smokeless tobacco: Current User  . Alcohol use Yes     Allergies   Clonazepam   Review of Systems Review of Systems  Constitutional: Negative for appetite change, chills, diaphoresis, fatigue and fever.  HENT: Positive for dental problem. Negative for mouth sores, sore throat and trouble swallowing.   Eyes: Negative for visual disturbance.  Respiratory: Negative for cough, chest tightness, shortness of breath and wheezing.   Cardiovascular: Positive for leg swelling. Negative for chest pain.  Gastrointestinal: Negative for abdominal distention, abdominal pain, diarrhea, nausea and vomiting.  Endocrine: Negative for polydipsia, polyphagia and polyuria.  Genitourinary: Negative for dysuria, frequency and hematuria.  Musculoskeletal: Negative for gait problem.  Skin: Negative for color change, pallor and rash.  Neurological: Negative for dizziness, syncope, light-headedness and headaches.  Hematological: Does not bruise/bleed easily.  Psychiatric/Behavioral: Negative for behavioral problems and confusion.     Physical Exam Updated Vital Signs BP 165/76 (BP Location: Right Arm)   Pulse 75   Temp 98.4 F (36.9 C) (Oral)   Resp 16   SpO2 99%   Physical Exam  Constitutional: She is oriented to person, place, and time. She appears well-developed and well-nourished. No distress.  HENT:  Head:  Normocephalic.  No external facial swelling. Carious tooth right maxillary molar. Majority of the central the tooth is absent the rim remains. No periodontal reaction on the buccal, or lingual gingiva. No soft tissue swelling to the face. No induration or adenopathy in the neck.  Eyes: Conjunctivae are normal. Pupils are equal, round, and reactive to light. No scleral icterus.  Neck: Normal range of motion. Neck supple. No thyromegaly present.  Cardiovascular: Normal rate and regular rhythm.  Exam reveals no gallop and no friction rub.   No murmur heard. Pulmonary/Chest: Effort normal and breath sounds normal. No respiratory distress. She has no wheezes. She has no rales.  Abdominal: Soft. Bowel sounds  are normal. She exhibits no distension. There is no tenderness. There is no rebound.  Musculoskeletal: Normal range of motion.  Neurological: She is alert and oriented to person, place, and time.  Skin: Skin is warm and dry. No rash noted.  1+ symmetric lower chin edema. Skin is not tense. Not erythematous or cellulitic normal sensation. Lungs are clear she has no JVD or dyspnea or any other findings to suggest acute CHF.  Psychiatric: She has a normal mood and affect. Her behavior is normal.     ED Treatments / Results  Labs (all labs ordered are listed, but only abnormal results are displayed) Labs Reviewed  CBC WITH DIFFERENTIAL/PLATELET - Abnormal; Notable for the following:       Result Value   RBC 3.75 (*)    Hemoglobin 11.0 (*)    HCT 32.8 (*)    All other components within normal limits  COMPREHENSIVE METABOLIC PANEL    EKG  EKG Interpretation None       Radiology No results found.  Procedures Procedures (including critical care time)  Medications Ordered in ED Medications  HYDROcodone-acetaminophen (NORCO/VICODIN) 5-325 MG per tablet 1 tablet (1 tablet Oral Given 03/20/16 2018)  penicillin v potassium (VEETID) tablet 500 mg (500 mg Oral Given 03/20/16 2018)      Initial Impression / Assessment and Plan / ED Course  I have reviewed the triage vital signs and the nursing notes.  Pertinent labs & imaging results that were available during my care of the patient were reviewed by me and considered in my medical decision making (see chart for details).  Clinical Course     Will evaluate her liver and kidney function. If stable, may change her from hydrochlorothiazide to Lasix for 7-10 days to hel  alleviate some of her swelling. Her tooth is in obvious need of dental repair. We'll temporize with antibiotic. Will avoid prescription narcotics for this chronic condition.  Final Clinical Impressions(s) / ED Diagnoses   Final diagnoses:  Dental infection  Dependent edema    New Prescriptions New Prescriptions   FUROSEMIDE (LASIX) 20 MG TABLET    Take 1 tablet (20 mg total) by mouth daily.   PENICILLIN V POTASSIUM (VEETID) 500 MG TABLET    Take 1 tablet (500 mg total) by mouth 4 (four) times daily.   POTASSIUM CHLORIDE (K-DUR) 10 MEQ TABLET    Take 1 tablet (10 mEq total) by mouth daily.     Tanna Furry, MD 03/20/16 2023

## 2016-03-20 NOTE — ED Triage Notes (Signed)
Pt reports toothache and possible abscess. Pt also reports bilateral ankle swelling. No resp difficulty noted.

## 2016-05-25 ENCOUNTER — Encounter (HOSPITAL_COMMUNITY): Payer: Self-pay | Admitting: Emergency Medicine

## 2016-05-25 ENCOUNTER — Emergency Department (HOSPITAL_COMMUNITY)
Admission: EM | Admit: 2016-05-25 | Discharge: 2016-05-25 | Disposition: A | Discharge status: Home / Self Care | Payer: Medicare Other | Attending: Emergency Medicine | Admitting: Emergency Medicine

## 2016-05-25 DIAGNOSIS — M7989 Other specified soft tissue disorders: Secondary | ICD-10-CM | POA: Diagnosis present

## 2016-05-25 DIAGNOSIS — Z8541 Personal history of malignant neoplasm of cervix uteri: Secondary | ICD-10-CM | POA: Diagnosis not present

## 2016-05-25 DIAGNOSIS — I2510 Atherosclerotic heart disease of native coronary artery without angina pectoris: Secondary | ICD-10-CM | POA: Diagnosis not present

## 2016-05-25 DIAGNOSIS — L03116 Cellulitis of left lower limb: Secondary | ICD-10-CM | POA: Diagnosis not present

## 2016-05-25 DIAGNOSIS — I10 Essential (primary) hypertension: Secondary | ICD-10-CM | POA: Diagnosis not present

## 2016-05-25 DIAGNOSIS — L03115 Cellulitis of right lower limb: Secondary | ICD-10-CM | POA: Diagnosis not present

## 2016-05-25 DIAGNOSIS — Z87891 Personal history of nicotine dependence: Secondary | ICD-10-CM | POA: Diagnosis not present

## 2016-05-25 DIAGNOSIS — R609 Edema, unspecified: Secondary | ICD-10-CM

## 2016-05-25 MED ORDER — CEPHALEXIN 500 MG PO CAPS: 500.0000 mg | ORAL_CAPSULE | Freq: Four times a day (QID) | ORAL | 0 refills | Status: DC

## 2016-05-25 MED ORDER — HYDROCODONE-ACETAMINOPHEN 5-500 MG PO TABS: 1.0000 | ORAL_TABLET | ORAL | 0 refills | Status: DC | PRN

## 2016-05-25 MED ORDER — CEPHALEXIN 250 MG PO CAPS
500.0000 mg | ORAL_CAPSULE | Freq: Once | ORAL | Status: AC
  Administered 2016-05-25: 500 mg via ORAL
  Filled 2016-05-25: qty 2

## 2016-05-25 NOTE — ED Provider Notes (Signed)
[facility] DEPT Provider Note   CSN: [id number] Arrival date & time: 05/25/16  1032     History   Chief Complaint Chief Complaint  Patient presents with  . Leg Swelling    HPI Haley Long is a 66 y.o. female.  Pt presents to the ED today with bilateral leg pain, redness, and swelling.  Pt said she was bitten on her legs 2 days ago.  Now, she has redness and swelling.  No fevers.      Past Medical History:  Diagnosis Date  . Arthritis   . Cancer ([place])    uterine  . Coronary artery disease   . Hypertension   . Irregular heart beat   . Varicose veins     Patient Active Problem List   Diagnosis Date Noted  . Varicose veins of lower extremities with other complications [id number]  . Irregular heart beat     Past Surgical History:  Procedure Laterality Date  . ABDOMINAL HYSTERECTOMY    . CATARACT EXTRACTION, BILATERAL    . CHOLECYSTECTOMY    . ENDOVENOUS ABLATION SAPHENOUS VEIN W/ LASER Left 04-21-2013   left greater saphenous vein and sclerotherapy left leg  . PELVIC EXENTERATION      OB History    No data available       Home Medications    Prior to Admission medications   Medication Sig Start Date End Date Taking? Authorizing Provider  Calcium Carb-Cholecalciferol 500-600 MG-UNIT CHEW Chew 1 tablet by mouth 2 (two) times daily.    Historical Provider, MD  cephALEXin (KEFLEX) 500 MG capsule Take 1 capsule (500 mg total) by mouth 4 (four) times daily. 05/25/16   Isla Pence, MD  cetirizine (ZYRTEC) 10 MG tablet Take 10 mg by mouth daily.    Historical Provider, MD  cholecalciferol (VITAMIN D) 1000 UNITS tablet Take 1,000 Units by mouth daily.    Historical Provider, MD  clindamycin (CLEOCIN) 150 MG capsule Take 3 capsules (450 mg total) by mouth 3 (three) times daily. 01/11/16   Hannah Muthersbaugh, PA-C  clonazePAM (KLONOPIN) 0.5 MG tablet Take 0.25 mg by mouth 2 (two) times daily.    Historical Provider, MD  doxycycline (VIBRAMYCIN) 100 MG  capsule Take 1 capsule (100 mg total) by mouth 2 (two) times daily. One po bid x 7 days 08/19/14   Clayton Bibles, PA-C  ferrous sulfate 325 (65 FE) MG tablet Take 325 mg by mouth daily.    Historical Provider, MD  furosemide (LASIX) 20 MG tablet Take 1 tablet (20 mg total) by mouth daily. 03/20/16   Tanna Furry, MD  hydrochlorothiazide (HYDRODIURIL) 50 MG tablet Take 50 mg by mouth daily.    Historical Provider, MD  HYDROcodone-acetaminophen (VICODIN) 5-500 MG tablet Take 1 tablet by mouth every 4 (four) hours as needed. For pain 05/25/16   Isla Pence, MD  hydrocortisone cream 1 % Apply to affected area 2 times daily 09/02/14   Dahlia Bailiff, PA-C  losartan (COZAAR) 50 MG tablet Take 50 mg by mouth daily.    Historical Provider, MD  Multiple Vitamin (MULITIVITAMIN WITH MINERALS) TABS Take 1 tablet by mouth daily.    Historical Provider, MD  naproxen (NAPROSYN) 500 MG tablet Take 1 tablet (500 mg total) by mouth 2 (two) times daily. 03/20/16   Tanna Furry, MD  omeprazole (PRILOSEC) 20 MG capsule Take 20 mg by mouth daily.    Historical Provider, MD  penicillin v potassium (VEETID) 500 MG tablet Take 1 tablet (500 mg total) by  mouth 4 (four) times daily. 03/20/16   Tanna Furry, MD  potassium chloride (K-DUR) 10 MEQ tablet Take 1 tablet (10 mEq total) by mouth daily. 03/20/16   Tanna Furry, MD  potassium chloride SA (K-DUR,KLOR-CON) 20 MEQ tablet Take 20 mEq by mouth daily.    Historical Provider, MD  pregabalin (LYRICA) 50 MG capsule Take 50 mg by mouth 3 (three) times daily.    Historical Provider, MD    Family History Family History  Problem Relation Age of Onset  . Hyperlipidemia Mother   . Hypertension Mother   . Other Mother     varicose veins  . Diabetes Father   . Hyperlipidemia Father   . Hypertension Father   . Peripheral vascular disease Father   . Diabetes Sister   . Hyperlipidemia Sister   . Hypertension Sister   . Other Sister     varicose veins  . Hyperlipidemia Brother   .  Hypertension Brother   . Peripheral vascular disease Brother   . Heart disease Daughter     before age 46    Social History Social History  Substance Use Topics  . Smoking status: Former Smoker    Types: Cigarettes    Quit date: 03/31/1969  . Smokeless tobacco: Current User  . Alcohol use Yes     Allergies   Clonazepam   Review of Systems Review of Systems  Skin: Positive for rash.  All other systems reviewed and are negative.    Physical Exam Updated Vital Signs BP 154/78 (BP Location: Left Arm)   Pulse 72   Temp 98 F (36.7 C) (Oral)   Resp 16   SpO2 97%   Physical Exam  Constitutional: She is oriented to person, place, and time. She appears well-developed and well-nourished.  HENT:  Head: Normocephalic and atraumatic.  Right Ear: External ear normal.  Left Ear: External ear normal.  Nose: Nose normal.  Mouth/Throat: Oropharynx is clear and moist.  Eyes: Conjunctivae and EOM are normal. Pupils are equal, round, and reactive to light.  Neck: Normal range of motion. Neck supple.  Cardiovascular: Normal rate, regular rhythm, normal heart sounds and intact distal pulses.   Pulmonary/Chest: Effort normal and breath sounds normal.  Abdominal: Soft. Bowel sounds are normal.  Musculoskeletal: She exhibits edema.  Neurological: She is alert and oriented to person, place, and time.  Skin:  Bilateral le edema/redness c/w cellulitis.  No calf tenderness.  No abscess.  Nursing note and vitals reviewed.    ED Treatments / Results  Labs (all labs ordered are listed, but only abnormal results are displayed) Labs Reviewed - No data to display  EKG  EKG Interpretation None       Radiology No results found.  Procedures Procedures (including critical care time)  Medications Ordered in ED Medications  cephALEXin (KEFLEX) capsule 500 mg (not administered)     Initial Impression / Assessment and Plan / ED Course  I have reviewed the triage vital signs and  the nursing notes.  Pertinent labs & imaging results that were available during my care of the patient were reviewed by me and considered in my medical decision making (see chart for details).    Pt will be started on Keflex.  She knows to return if the redness and swelling worsen.  F/u with pcp.  Final Clinical Impressions(s) / ED Diagnoses   Final diagnoses:  Peripheral edema  Cellulitis of left lower extremity  Cellulitis of right lower extremity    New Prescriptions  Current Discharge Medication List       Isla Pence, MD 05/25/16 1130

## 2016-05-25 NOTE — ED Triage Notes (Signed)
Pt sts bilateral leg pain and swelling x 2 days with redness noted

## 2016-06-07 ENCOUNTER — Encounter (HOSPITAL_COMMUNITY): Payer: Self-pay | Admitting: Emergency Medicine

## 2016-06-07 ENCOUNTER — Emergency Department (HOSPITAL_COMMUNITY): Payer: Medicare Other

## 2016-06-07 ENCOUNTER — Emergency Department (HOSPITAL_COMMUNITY)
Admission: EM | Admit: 2016-06-07 | Discharge: 2016-06-08 | Disposition: A | Discharge status: Home / Self Care | Payer: Medicare Other | Attending: Emergency Medicine | Admitting: Emergency Medicine

## 2016-06-07 DIAGNOSIS — L03115 Cellulitis of right lower limb: Secondary | ICD-10-CM | POA: Diagnosis not present

## 2016-06-07 DIAGNOSIS — I10 Essential (primary) hypertension: Secondary | ICD-10-CM | POA: Insufficient documentation

## 2016-06-07 DIAGNOSIS — I2510 Atherosclerotic heart disease of native coronary artery without angina pectoris: Secondary | ICD-10-CM | POA: Insufficient documentation

## 2016-06-07 DIAGNOSIS — Z87891 Personal history of nicotine dependence: Secondary | ICD-10-CM | POA: Diagnosis not present

## 2016-06-07 DIAGNOSIS — R600 Localized edema: Secondary | ICD-10-CM | POA: Diagnosis not present

## 2016-06-07 DIAGNOSIS — R609 Edema, unspecified: Secondary | ICD-10-CM

## 2016-06-07 DIAGNOSIS — Z8541 Personal history of malignant neoplasm of cervix uteri: Secondary | ICD-10-CM | POA: Diagnosis not present

## 2016-06-07 DIAGNOSIS — M7989 Other specified soft tissue disorders: Secondary | ICD-10-CM | POA: Diagnosis present

## 2016-06-07 LAB — BASIC METABOLIC PANEL
ANION GAP: 11 (ref 5–15)
BUN: 11 mg/dL (ref 6–20)
CHLORIDE: 95 mmol/L — AB (ref 101–111)
CO2: 29 mmol/L (ref 22–32)
Calcium: 9.6 mg/dL (ref 8.9–10.3)
Creatinine, Ser: 0.64 mg/dL (ref 0.44–1.00)
GFR calc non Af Amer: >60 mL/min (ref >60)
GLUCOSE: 100 mg/dL — AB (ref 65–99)
Potassium: 3.6 mmol/L (ref 3.5–5.1)
Sodium: 135 mmol/L (ref 135–145)

## 2016-06-07 LAB — CBC WITH DIFFERENTIAL/PLATELET
Basophils Absolute: 0 10*3/uL (ref 0.0–0.1)
Basophils Relative: 1 %
Eosinophils Absolute: 0.3 10*3/uL (ref 0.0–0.7)
Eosinophils Relative: 5 %
HCT: 32.6 % — ABNORMAL LOW (ref 36.0–46.0)
HEMOGLOBIN: 11 g/dL — AB (ref 12.0–15.0)
Lymphocytes Relative: 35 %
Lymphs Abs: 1.8 10*3/uL (ref 0.7–4.0)
MCH: 29.8 pg (ref 26.0–34.0)
MCHC: 33.7 g/dL (ref 30.0–36.0)
MCV: 88.3 fL (ref 78.0–100.0)
Monocytes Absolute: 0.6 10*3/uL (ref 0.1–1.0)
Monocytes Relative: 11 %
NEUTROS PCT: 48 %
Neutro Abs: 2.4 10*3/uL (ref 1.7–7.7)
Platelets: 267 10*3/uL (ref 150–400)
RBC: 3.69 MIL/uL — ABNORMAL LOW (ref 3.87–5.11)
RDW: 12.7 % (ref 11.5–15.5)
WBC: 5 10*3/uL (ref 4.0–10.5)

## 2016-06-07 LAB — BRAIN NATRIURETIC PEPTIDE: B Natriuretic Peptide: 36.1 pg/mL (ref 0.0–100.0)

## 2016-06-07 MED ORDER — VANCOMYCIN HCL IN DEXTROSE 1-5 GM/200ML-% IV SOLN
1000.0000 mg | Freq: Once | INTRAVENOUS | Status: AC
  Administered 2016-06-08: 1000 mg via INTRAVENOUS

## 2016-06-07 NOTE — ED Provider Notes (Signed)
[place] DEPT Provider Note   CSN: [id number] Arrival date & time: 06/07/16  1813     History   Chief Complaint Chief Complaint  Patient presents with  . Leg Swelling    HPI Haley Long is a 66 y.o. female with a hx of CAD, arthritis presents to the Emergency Department complaining of gradual, persistent, progressively worsening BLE swelling R>L onset 4 days ago.   Pt was seen on 05/25/16 for the same and d/c home with keflex. SSX improved initially but returned 2 days after completing the antibiotic.  She reports the right leg is now worse than it was initially.  Associated symptoms include significant pain and swelling in the legs up to the knee. No aggravating or alleviating factors.  Pt denies fever, rash, cough, N/V.  No hx of DM.   No hx of trauma or known breaks in the skin.  No hx of DVT With multiple negative workups for same. Patient also denies recent immobilization, surgeries, fractures or estrogen usage.    The history is provided by the patient and medical records. No language interpreter was used.    Past Medical History:  Diagnosis Date  . Arthritis   . Cancer ([place])    uterine  . Coronary artery disease   . Hypertension   . Irregular heart beat   . Varicose veins     Patient Active Problem List   Diagnosis Date Noted  . Varicose veins of lower extremities with other complications 83/15/1761  . Irregular heart beat     Past Surgical History:  Procedure Laterality Date  . ABDOMINAL HYSTERECTOMY    . CATARACT EXTRACTION, BILATERAL    . CHOLECYSTECTOMY    . ENDOVENOUS ABLATION SAPHENOUS VEIN W/ LASER Left 04-21-2013   left greater saphenous vein and sclerotherapy left leg  . PELVIC EXENTERATION      OB History    No data available       Home Medications    Prior to Admission medications   Medication Sig Start Date End Date Taking? Authorizing Provider  cholecalciferol (VITAMIN D) 1000 UNITS tablet Take 1,000 Units by mouth daily.   Yes  Historical Provider, MD  clonazePAM (KLONOPIN) 0.5 MG tablet Take 0.25 mg by mouth 2 (two) times daily.   Yes Historical Provider, MD  furosemide (LASIX) 20 MG tablet Take 1 tablet (20 mg total) by mouth daily. 03/20/16  Yes Tanna Furry, MD  hydrochlorothiazide (HYDRODIURIL) 50 MG tablet Take 50 mg by mouth daily.   Yes Historical Provider, MD  losartan (COZAAR) 50 MG tablet Take 50 mg by mouth daily.   Yes Historical Provider, MD  Multiple Vitamin (MULITIVITAMIN WITH MINERALS) TABS Take 1 tablet by mouth daily.   Yes Historical Provider, MD  omeprazole (PRILOSEC) 20 MG capsule Take 20 mg by mouth daily.   Yes Historical Provider, MD  pregabalin (LYRICA) 50 MG capsule Take 50 mg by mouth 3 (three) times daily.   Yes Historical Provider, MD  doxycycline (VIBRAMYCIN) 100 MG capsule Take 1 capsule (100 mg total) by mouth 2 (two) times daily. 06/08/16   Jarrett Soho Afton Lavalle, PA-C    Family History Family History  Problem Relation Age of Onset  . Hyperlipidemia Mother   . Hypertension Mother   . Other Mother     varicose veins  . Diabetes Father   . Hyperlipidemia Father   . Hypertension Father   . Peripheral vascular disease Father   . Diabetes Sister   . Hyperlipidemia Sister   .  Hypertension Sister   . Other Sister     varicose veins  . Hyperlipidemia Brother   . Hypertension Brother   . Peripheral vascular disease Brother   . Heart disease Daughter     before age 95    Social History Social History  Substance Use Topics  . Smoking status: Former Smoker    Types: Cigarettes    Quit date: 03/31/1969  . Smokeless tobacco: Current User  . Alcohol use Yes     Allergies   Clonazepam   Review of Systems Review of Systems  Constitutional: Negative for chills and fever.  Respiratory: Negative for cough, chest tightness and shortness of breath.   Cardiovascular: Negative for chest pain.  Gastrointestinal: Negative for nausea and vomiting.  Musculoskeletal: Positive for  arthralgias and myalgias.  Skin: Positive for color change.  Neurological: Negative for headaches.  All other systems reviewed and are negative.    Physical Exam Updated Vital Signs BP (!) 113/52   Pulse 80   Temp 98.1 F (36.7 C) (Oral)   Resp 16   Ht 5\' 1"  (1.549 m)   Wt 81.6 kg   SpO2 100%   BMI 34.01 kg/m   Physical Exam  Constitutional: She appears well-developed and well-nourished. No distress.  Awake, alert, nontoxic appearance  HENT:  Head: Normocephalic and atraumatic.  Mouth/Throat: Oropharynx is clear and moist. No oropharyngeal exudate.  Eyes: Conjunctivae are normal. No scleral icterus.  Neck: Normal range of motion. Neck supple.  Cardiovascular: Normal rate, regular rhythm and intact distal pulses.   Pulmonary/Chest: Effort normal and breath sounds normal. No respiratory distress. She has no wheezes.  Equal chest expansion  Abdominal: Soft. Bowel sounds are normal. She exhibits no mass. There is no tenderness. There is no rebound and no guarding.  Musculoskeletal: Normal range of motion. She exhibits edema (BLE).  Right knee with full range of motion and no significant tenderness to palpation, no large joint effusion, no circumferential erythema, no induration  Neurological: She is alert.  Speech is clear and goal oriented Moves extremities without ataxia  Skin: Skin is warm and dry. She is not diaphoretic. There is erythema.  Erythema and increased warmth to the RLE, largely concentrated around the right medial portion of the knee and posterior thigh with 1+ pitting edema and streaking of the erythema through the medial and posterior thigh; TTP, but no crepitus  Psychiatric: She has a normal mood and affect.  Nursing note and vitals reviewed.    ED Treatments / Results  Labs (all labs ordered are listed, but only abnormal results are displayed) Labs Reviewed  CBC WITH DIFFERENTIAL/PLATELET - Abnormal; Notable for the following:       Result Value   RBC  3.69 (*)    Hemoglobin 11.0 (*)    HCT 32.6 (*)    All other components within normal limits  BASIC METABOLIC PANEL - Abnormal; Notable for the following:    Chloride 95 (*)    Glucose, Bld 100 (*)    All other components within normal limits  BRAIN NATRIURETIC PEPTIDE    EKG  EKG Interpretation  Date/Time:  Saturday June 07 2016 23:16:50 EST Ventricular Rate:  69 PR Interval:    QRS Duration: 130 QT Interval:  412 QTC Calculation: 442 R Axis:   48 Text Interpretation:  Sinus rhythm Nonspecific intraventricular conduction delay since last tracing no significant change Confirmed by Mitch  MD, BRIAN ([id number]) on 06/07/2016 11:23:26 PM  Radiology Dg Chest 2 View  Result Date: 06/08/2016 CLINICAL DATA:  Lower extremity swelling for 2 days. EXAM: CHEST  2 VIEW COMPARISON:  03/29/2015 FINDINGS: The heart size and mediastinal contours are within normal limits. Both lungs are clear. The visualized skeletal structures are unremarkable. IMPRESSION: No active cardiopulmonary disease. Electronically Signed   By: Andreas Newport M.D.   On: 06/08/2016 00:37    Procedures Procedures (including critical care time)  Medications Ordered in ED Medications  vancomycin (VANCOCIN) IVPB 1000 mg/200 mL premix (1,000 mg Intravenous New Bag/Given 06/08/16 0015)     Initial Impression / Assessment and Plan / ED Course  I have reviewed the triage vital signs and the nursing notes.  Pertinent labs & imaging results that were available during my care of the patient were reviewed by me and considered in my medical decision making (see chart for details).  Clinical Course as of Jun 08 124  Sun Jun 08, 2016  0109 No leukocytosis WBC: 5.0 [HM]  0109 No evidence of CHF DG Chest 2 View [HM]  0110 NSR EKG 12-Lead [HM]  0110 normal B Natriuretic Peptide: 36.1 [HM]  0110 WNL BP: 144/63 [HM]    Clinical Course User Index [HM] Abigail Butts, PA-C    Patient presents with return of  cellulitis to the right leg and 1+ pitting edema bilaterally. Less likely to the DVT. Patient vital signs are within normal limits. She is afebrile. Lab workup is reassuring and there is no evidence of congestive heart failure. Patient given pink myosin here in the emergency department. She is well appearing and has plans to follow-up with her primary care physician on Monday. Will be discharged home with doxycycline. She has been given strict return precautions to return immediately to the emergency department if symptoms worsen including worsening of the redness, increase in pain, difficulty walking or any other concerning symptoms.  The patient was discussed with and seen by Dr. Noemi Chapel who agrees with the treatment plan.   Final Clinical Impressions(s) / ED Diagnoses   Final diagnoses:  Cellulitis of right lower extremity  Peripheral edema    New Prescriptions New Prescriptions   DOXYCYCLINE (VIBRAMYCIN) 100 MG CAPSULE    Take 1 capsule (100 mg total) by mouth 2 (two) times daily.     Jarrett Soho Adlee Paar, PA-C 06/08/16 0126    Noemi Chapel, MD 06/08/16 1040

## 2016-06-07 NOTE — ED Provider Notes (Signed)
The patient is a 66 year old female, history of arthritis, cancer of the uterus, coronary disease and hypertension.  According to the medical record the patient was placed on cephalexin for lower extremity cellulitis and states that initially she got much better but then has had a recurrence of some redness around the right knee. She does not have any history of DVT and has had multiple ultrasounds showing no DVT. She reports that the redness around the right knee is gotten slightly worse. She denies fevers, on exam the patient doesn't fact have 1+ pitting edema to the right lower extremity with some increased warmth and redness to the medial right knee however there is a supple joint at the right knee, no pain with range of motion, there is mild tenderness with touching the skin on the medial knee. There is slight streaking into the right medial thigh. She has good pulses at the feet. She does not have a fever, is not tachycardic and does not appear to be in distress.  Labs, IV vancomycin, anticipate changing to MRSA coverage with doxycycline. Disposition pending on labs and patient tolerating medications.  Medical screening examination/treatment/procedure(s) were conducted as a shared visit with non-physician practitioner(s) and myself.  I personally evaluated the patient during the encounter.  Clinical Impression:   Final diagnoses:  Cellulitis of right lower extremity  Peripheral edema         Noemi Chapel, MD 06/08/16 1039

## 2016-06-07 NOTE — ED Triage Notes (Signed)
Pt. Stated. I was here a week and half ago for redness and infection in my legs. Yesterday my rt. Leg started swelling and it feels like its going to burst. Leg is tight from knee down. Rt. Leg warmer to touch than left.

## 2016-06-08 DIAGNOSIS — L03115 Cellulitis of right lower limb: Secondary | ICD-10-CM | POA: Diagnosis not present

## 2016-06-08 MED ORDER — DOXYCYCLINE HYCLATE 100 MG PO CAPS: 100.0000 mg | ORAL_CAPSULE | Freq: Two times a day (BID) | ORAL | 0 refills | Status: DC

## 2016-06-08 NOTE — Discharge Instructions (Signed)
1. Medications: doxycycline, usual home medications 2. Treatment: rest, drink plenty of fluids,  3. Follow Up: Please followup with your primary doctor in 1-2 days for discussion of your diagnoses and further evaluation after today's visit; if you do not have a primary care doctor use the resource guide provided to find one; Please return to the ER for worsening redness, fevers, chills, nausea, vomiting or other concerns

## 2016-06-08 NOTE — ED Notes (Signed)
Pt given coffee and sandwich

## 2016-08-09 ENCOUNTER — Encounter (HOSPITAL_COMMUNITY): Payer: Self-pay | Admitting: Emergency Medicine

## 2016-08-09 DIAGNOSIS — T7840XA Allergy, unspecified, initial encounter: Secondary | ICD-10-CM | POA: Insufficient documentation

## 2016-08-09 DIAGNOSIS — I2510 Atherosclerotic heart disease of native coronary artery without angina pectoris: Secondary | ICD-10-CM | POA: Diagnosis not present

## 2016-08-09 DIAGNOSIS — Z8541 Personal history of malignant neoplasm of cervix uteri: Secondary | ICD-10-CM | POA: Diagnosis not present

## 2016-08-09 DIAGNOSIS — I10 Essential (primary) hypertension: Secondary | ICD-10-CM | POA: Insufficient documentation

## 2016-08-09 DIAGNOSIS — M7989 Other specified soft tissue disorders: Secondary | ICD-10-CM | POA: Diagnosis present

## 2016-08-09 DIAGNOSIS — Z87891 Personal history of nicotine dependence: Secondary | ICD-10-CM | POA: Insufficient documentation

## 2016-08-09 DIAGNOSIS — Z79899 Other long term (current) drug therapy: Secondary | ICD-10-CM | POA: Insufficient documentation

## 2016-08-09 NOTE — ED Notes (Signed)
[facility]:  Pt presents to ED for assessment of increasing bilateral leg edema, worse in the right.  Hx of diuretics with a recent increase in dose.  Pt also c/o chronic right side pain after being dignosed with "some sort of liver disease".  Vitals stable, NAD.

## 2016-08-10 ENCOUNTER — Emergency Department (HOSPITAL_COMMUNITY)
Admission: EM | Admit: 2016-08-10 | Discharge: 2016-08-10 | Disposition: A | Discharge status: Home / Self Care | Payer: Medicare Other | Attending: Emergency Medicine | Admitting: Emergency Medicine

## 2016-08-10 DIAGNOSIS — T7840XA Allergy, unspecified, initial encounter: Secondary | ICD-10-CM

## 2016-08-10 LAB — COMPREHENSIVE METABOLIC PANEL
ALBUMIN: 4 g/dL (ref 3.5–5.0)
ALT: 13 U/L — ABNORMAL LOW (ref 14–54)
ANION GAP: 9 (ref 5–15)
AST: 21 U/L (ref 15–41)
Alkaline Phosphatase: 41 U/L (ref 38–126)
BILIRUBIN TOTAL: 0.5 mg/dL (ref 0.3–1.2)
BUN: 13 mg/dL (ref 6–20)
CHLORIDE: 99 mmol/L — AB (ref 101–111)
CO2: 24 mmol/L (ref 22–32)
Calcium: 9.7 mg/dL (ref 8.9–10.3)
Creatinine, Ser: 0.96 mg/dL (ref 0.44–1.00)
GFR calc Af Amer: >60 mL/min (ref >60)
GFR calc non Af Amer: 60 mL/min — ABNORMAL LOW (ref >60)
GLUCOSE: 113 mg/dL — AB (ref 65–99)
POTASSIUM: 3.7 mmol/L (ref 3.5–5.1)
SODIUM: 132 mmol/L — AB (ref 135–145)
TOTAL PROTEIN: 6.8 g/dL (ref 6.5–8.1)

## 2016-08-10 LAB — CBC
HEMATOCRIT: 30.8 % — AB (ref 36.0–46.0)
HEMOGLOBIN: 10.1 g/dL — AB (ref 12.0–15.0)
MCH: 28.5 pg (ref 26.0–34.0)
MCHC: 32.8 g/dL (ref 30.0–36.0)
MCV: 86.8 fL (ref 78.0–100.0)
Platelets: 273 10*3/uL (ref 150–400)
RBC: 3.55 MIL/uL — ABNORMAL LOW (ref 3.87–5.11)
RDW: 13 % (ref 11.5–15.5)
WBC: 6.1 10*3/uL (ref 4.0–10.5)

## 2016-08-10 LAB — LIPASE, BLOOD: Lipase: 45 U/L (ref 11–51)

## 2016-08-10 MED ORDER — DOXYCYCLINE HYCLATE 100 MG PO CAPS: 100.0000 mg | ORAL_CAPSULE | Freq: Two times a day (BID) | ORAL | 0 refills | Status: AC

## 2016-08-10 MED ORDER — DIPHENHYDRAMINE HCL 25 MG PO CAPS
25.0000 mg | ORAL_CAPSULE | Freq: Once | ORAL | Status: AC
  Administered 2016-08-10: 25 mg via ORAL
  Filled 2016-08-10: qty 1

## 2016-08-10 MED ORDER — FAMOTIDINE 20 MG PO TABS
20.0000 mg | ORAL_TABLET | Freq: Once | ORAL | Status: AC
  Administered 2016-08-10: 20 mg via ORAL
  Filled 2016-08-10: qty 1

## 2016-08-10 MED ORDER — FAMOTIDINE 20 MG PO TABS: 20.0000 mg | ORAL_TABLET | Freq: Two times a day (BID) | ORAL | 0 refills | Status: DC

## 2016-08-10 MED ORDER — DIPHENHYDRAMINE HCL 25 MG PO TABS: 25.0000 mg | ORAL_TABLET | Freq: Four times a day (QID) | ORAL | 0 refills | Status: DC

## 2016-08-10 MED ORDER — DOXYCYCLINE HYCLATE 100 MG PO TABS
100.0000 mg | ORAL_TABLET | Freq: Once | ORAL | Status: AC
  Administered 2016-08-10: 100 mg via ORAL
  Filled 2016-08-10: qty 1

## 2016-08-10 NOTE — ED Provider Notes (Signed)
[facility] DEPT Provider Note   CSN: [id number] Arrival date & time: 08/09/16  2354  By signing my name below, I, Dora Sims, attest that this documentation has been prepared under the direction and in the presence of physician practitioner, Jola Schmidt, MD. Electronically Signed: Dora Sims, Scribe. 08/10/2016. 3:36 AM.  History   Chief Complaint Chief Complaint  Patient presents with  . Leg Swelling   The history is provided by the patient. No language interpreter was used.    HPI Comments: Haley Long is a 66 y.o. female with PMHx including CAD and HTN who presents to the Emergency Department via EMS complaining of right lower leg swelling, redness, and pain beginning a couple of hours ago. Patient believes she was bitten by an insect on her right lower leg but has not visualized any insects on her body. Patient notes the area is pruritic and she endorses pain exacerbation with applied pressure to her right lower leg. No alleviating factors noted. No recent injuries to her right lower extremity. She denies chest pain, shortness of breath, fevers, chills, or any other associated symptoms.  Past Medical History:  Diagnosis Date  . Arthritis   . Cancer ([place])    uterine  . Coronary artery disease   . Hypertension   . Irregular heart beat   . Varicose veins     Patient Active Problem List   Diagnosis Date Noted  . Varicose veins of lower extremities with other complications 27/25/3664  . Irregular heart beat     Past Surgical History:  Procedure Laterality Date  . ABDOMINAL HYSTERECTOMY    . CATARACT EXTRACTION, BILATERAL    . CHOLECYSTECTOMY    . ENDOVENOUS ABLATION SAPHENOUS VEIN W/ LASER Left 04-21-2013   left greater saphenous vein and sclerotherapy left leg  . PELVIC EXENTERATION      OB History    No data available       Home Medications    Prior to Admission medications   Medication Sig Start Date End Date Taking? Authorizing Provider    cholecalciferol (VITAMIN D) 1000 UNITS tablet Take 1,000 Units by mouth daily.    [provider]  clonazePAM (KLONOPIN) 0.5 MG tablet Take 0.25 mg by mouth 2 (two) times daily.    [provider]  doxycycline (VIBRAMYCIN) 100 MG capsule Take 1 capsule (100 mg total) by mouth 2 (two) times daily. 06/08/16   Muthersbaugh, Jarrett Soho, PA-C  furosemide (LASIX) 20 MG tablet Take 1 tablet (20 mg total) by mouth daily. 03/20/16   Tanna Furry, MD  hydrochlorothiazide (HYDRODIURIL) 50 MG tablet Take 50 mg by mouth daily.    [provider]  losartan (COZAAR) 50 MG tablet Take 50 mg by mouth daily.    [provider]  Multiple Vitamin (MULITIVITAMIN WITH MINERALS) TABS Take 1 tablet by mouth daily.    [provider]  omeprazole (PRILOSEC) 20 MG capsule Take 20 mg by mouth daily.    [provider]  pregabalin (LYRICA) 50 MG capsule Take 50 mg by mouth 3 (three) times daily.    [provider]    Family History Family History  Problem Relation Age of Onset  . Hyperlipidemia Mother   . Hypertension Mother   . Other Mother        varicose veins  . Diabetes Father   . Hyperlipidemia Father   . Hypertension Father   . Peripheral vascular disease Father   . Diabetes Sister   . Hyperlipidemia Sister   .  Hypertension Sister   . Other Sister        varicose veins  . Hyperlipidemia Brother   . Hypertension Brother   . Peripheral vascular disease Brother   . Heart disease Daughter        before age 58    Social History Social History  Substance Use Topics  . Smoking status: Former Smoker    Types: Cigarettes    Quit date: 03/31/1969  . Smokeless tobacco: Current User  . Alcohol use Yes     Allergies   Clonazepam   Review of Systems Review of Systems  All other systems reviewed and are negative for acute change except as noted in the HPI. Physical Exam Updated Vital Signs BP (!) 150/68 (BP Location: Left Arm)   Pulse 63    Temp 98.3 F (36.8 C) (Oral)   Resp 18   SpO2 98%   Physical Exam  Constitutional: She is oriented to person, place, and time. She appears well-developed and well-nourished. No distress.  HENT:  Head: Normocephalic and atraumatic.  Eyes: EOM are normal.  Neck: Normal range of motion.  Cardiovascular: Normal rate, regular rhythm and normal heart sounds.   Pulmonary/Chest: Effort normal and breath sounds normal.  Abdominal: Soft. She exhibits no distension. There is no tenderness.  Musculoskeletal: Normal range of motion.     Neurological: She is alert and oriented to person, place, and time.  Skin: Skin is warm and dry.  Right lower leg: Erythema of the right lower leg on the anteromedial side without abscess.  No crepitus.  Normal pulses right foot  Psychiatric: She has a normal mood and affect. Judgment normal.  Nursing note and vitals reviewed.  ED Treatments / Results  Labs (all labs ordered are listed, but only abnormal results are displayed) Labs Reviewed  COMPREHENSIVE METABOLIC PANEL - Abnormal; Notable for the following:       Result Value   Sodium 132 (*)    Chloride 99 (*)    Glucose, Bld 113 (*)    ALT 13 (*)    GFR calc non Af Amer 60 (*)    All other components within normal limits  CBC - Abnormal; Notable for the following:    RBC 3.55 (*)    Hemoglobin 10.1 (*)    HCT 30.8 (*)    All other components within normal limits  LIPASE, BLOOD    EKG  EKG Interpretation None       Radiology No results found.  Procedures Procedures (including critical care time)  DIAGNOSTIC STUDIES: Oxygen Saturation is 98% on RA, normal by my interpretation.    COORDINATION OF CARE: 3:33 AM Discussed treatment plan with pt at bedside and pt agreed to plan.  Medications Ordered in ED Medications  diphenhydrAMINE (BENADRYL) capsule 25 mg (25 mg Oral Given 08/10/16 0603)  famotidine (PEPCID) tablet 20 mg (20 mg Oral Given 08/10/16 0603)  doxycycline (VIBRA-TABS)  tablet 100 mg (100 mg Oral Given 08/10/16 0603)     Initial Impression / Assessment and Plan / ED Course  I have reviewed the triage vital signs and the nursing notes.  Pertinent labs & imaging results that were available during my care of the patient were reviewed by me and considered in my medical decision making (see chart for details).     Suspect allergic rxn. Could be component of early cellulitis. Pt will be treated for both. Pulses present. Abdomen nontender. pcp follow up  Final Clinical Impressions(s) / ED Diagnoses  Final diagnoses:  Allergic reaction, initial encounter    New Prescriptions Discharge Medication List as of 08/10/2016  5:11 AM    START taking these medications   Details  diphenhydrAMINE (BENADRYL) 25 MG tablet Take 1 tablet (25 mg total) by mouth every 6 (six) hours., Starting Sun 08/10/2016, Print    famotidine (PEPCID) 20 MG tablet Take 1 tablet (20 mg total) by mouth 2 (two) times daily., Starting Sun 08/10/2016, Print       I personally performed the services described in this documentation, which was scribed in my presence. The recorded information has been reviewed and is accurate.       Jola Schmidt, MD 08/19/16 [phone]

## 2016-09-20 ENCOUNTER — Emergency Department (HOSPITAL_COMMUNITY)
Admission: EM | Admit: 2016-09-20 | Discharge: 2016-09-20 | Disposition: A | Discharge status: Home / Self Care | Payer: Medicare Other | Attending: Emergency Medicine | Admitting: Emergency Medicine

## 2016-09-20 ENCOUNTER — Encounter (HOSPITAL_COMMUNITY): Payer: Self-pay | Admitting: Emergency Medicine

## 2016-09-20 ENCOUNTER — Emergency Department (HOSPITAL_BASED_OUTPATIENT_CLINIC_OR_DEPARTMENT_OTHER)
Admit: 2016-09-20 | Discharge: 2016-09-20 | Disposition: A | Discharge status: Home / Self Care | Payer: Medicare Other | Attending: Emergency Medicine | Admitting: Emergency Medicine

## 2016-09-20 DIAGNOSIS — L03115 Cellulitis of right lower limb: Secondary | ICD-10-CM

## 2016-09-20 DIAGNOSIS — M7989 Other specified soft tissue disorders: Secondary | ICD-10-CM | POA: Diagnosis not present

## 2016-09-20 DIAGNOSIS — Z8541 Personal history of malignant neoplasm of cervix uteri: Secondary | ICD-10-CM | POA: Insufficient documentation

## 2016-09-20 DIAGNOSIS — I10 Essential (primary) hypertension: Secondary | ICD-10-CM | POA: Insufficient documentation

## 2016-09-20 DIAGNOSIS — Z79899 Other long term (current) drug therapy: Secondary | ICD-10-CM | POA: Insufficient documentation

## 2016-09-20 DIAGNOSIS — I2510 Atherosclerotic heart disease of native coronary artery without angina pectoris: Secondary | ICD-10-CM | POA: Diagnosis not present

## 2016-09-20 DIAGNOSIS — Z87891 Personal history of nicotine dependence: Secondary | ICD-10-CM | POA: Diagnosis not present

## 2016-09-20 DIAGNOSIS — R2241 Localized swelling, mass and lump, right lower limb: Secondary | ICD-10-CM | POA: Diagnosis present

## 2016-09-20 LAB — CBC
HEMATOCRIT: 30.7 % — AB (ref 36.0–46.0)
HEMOGLOBIN: 9.9 g/dL — AB (ref 12.0–15.0)
MCH: 28 pg (ref 26.0–34.0)
MCHC: 32.2 g/dL (ref 30.0–36.0)
MCV: 87 fL (ref 78.0–100.0)
Platelets: 243 10*3/uL (ref 150–400)
RBC: 3.53 MIL/uL — AB (ref 3.87–5.11)
RDW: 13.6 % (ref 11.5–15.5)
WBC: 4.9 10*3/uL (ref 4.0–10.5)

## 2016-09-20 LAB — BASIC METABOLIC PANEL
ANION GAP: 7 (ref 5–15)
BUN: 9 mg/dL (ref 6–20)
CO2: 27 mmol/L (ref 22–32)
Calcium: 9.1 mg/dL (ref 8.9–10.3)
Chloride: 98 mmol/L — ABNORMAL LOW (ref 101–111)
Creatinine, Ser: 0.78 mg/dL (ref 0.44–1.00)
GFR calc non Af Amer: >60 mL/min (ref >60)
Glucose, Bld: 101 mg/dL — ABNORMAL HIGH (ref 65–99)
POTASSIUM: 3.2 mmol/L — AB (ref 3.5–5.1)
Sodium: 132 mmol/L — ABNORMAL LOW (ref 135–145)

## 2016-09-20 MED ORDER — TRAMADOL HCL 50 MG PO TABS
50.0000 mg | ORAL_TABLET | Freq: Once | ORAL | Status: DC
  Filled 2016-09-20: qty 1

## 2016-09-20 MED ORDER — CEPHALEXIN 500 MG PO CAPS: 500.0000 mg | ORAL_CAPSULE | Freq: Three times a day (TID) | ORAL | 0 refills | Status: DC

## 2016-09-20 MED ORDER — POTASSIUM CHLORIDE CRYS ER 20 MEQ PO TBCR
40.0000 meq | EXTENDED_RELEASE_TABLET | Freq: Once | ORAL | Status: AC
  Administered 2016-09-20: 40 meq via ORAL
  Filled 2016-09-20: qty 2

## 2016-09-20 MED ORDER — VANCOMYCIN HCL IN DEXTROSE 1-5 GM/200ML-% IV SOLN
1000.0000 mg | Freq: Once | INTRAVENOUS | Status: AC
Start: 2016-09-20 — End: 2016-09-20
  Administered 2016-09-20: 1000 mg via INTRAVENOUS
  Filled 2016-09-20: qty 200

## 2016-09-20 NOTE — ED Notes (Signed)
Returned from vascular study

## 2016-09-20 NOTE — Progress Notes (Signed)
VASCULAR LAB PRELIMINARY  PRELIMINARY  PRELIMINARY  PRELIMINARY  Bilateral lower extremity venous duplex completed.    Preliminary report:  There is no DVT or SVT noted in the bilateral lower extremities.  Called report to Dr. Thereasa Solo, [facility], RVT 09/20/2016, 8:42 AM

## 2016-09-20 NOTE — ED Notes (Signed)
Transported to vascular. 

## 2016-09-20 NOTE — ED Provider Notes (Signed)
[facility] DEPT Provider Note   CSN: [id number] Arrival date & time: 09/20/16  0327     History   Chief Complaint Chief Complaint  Patient presents with  . Leg Swelling    HPI Haley Long is a 66 y.o. female.  HPI   66 year old female with history of CAD, urine cancer, irregular heartbeat and arthritis brought here via EMS from home for evaluation of leg pain and swelling. Patient reports gradual onset of bilateral leg swelling ongoing for the past 3 days. Swelling initially started in her right leg and now she notices it in her left leg as well. She described pain as a pressure tightness sensation, moderate intensity with "heat". She felt that this could be an infection. She was seen by her PCP yesterday but states nothing was been done for her symptoms. She is currently denies having fever, chills, back pain, numbness or weakness. She denies any recent injury. She does not have any history of DVT. She does have remote history of uterine cancer status post chemotherapy and resection. No history of CHF. Denies chest pain or shortness of breath.  Past Medical History:  Diagnosis Date  . Arthritis   . Cancer ([place])    uterine  . Coronary artery disease   . Hypertension   . Irregular heart beat   . Varicose veins     Patient Active Problem List   Diagnosis Date Noted  . Varicose veins of lower extremities with other complications 33/29/5188  . Irregular heart beat     Past Surgical History:  Procedure Laterality Date  . ABDOMINAL HYSTERECTOMY    . CATARACT EXTRACTION, BILATERAL    . CHOLECYSTECTOMY    . ENDOVENOUS ABLATION SAPHENOUS VEIN W/ LASER Left 04-21-2013   left greater saphenous vein and sclerotherapy left leg  . PELVIC EXENTERATION      OB History    No data available       Home Medications    Prior to Admission medications   Medication Sig Start Date End Date Taking? Authorizing Provider  cholecalciferol (VITAMIN D) 1000 UNITS tablet Take 1,000  Units by mouth daily.    [provider]  clonazePAM (KLONOPIN) 0.5 MG tablet Take 0.25 mg by mouth 2 (two) times daily.    [provider]  diphenhydrAMINE (BENADRYL) 25 MG tablet Take 1 tablet (25 mg total) by mouth every 6 (six) hours. 08/10/16   Jola Schmidt, MD  famotidine (PEPCID) 20 MG tablet Take 1 tablet (20 mg total) by mouth 2 (two) times daily. 08/10/16   Jola Schmidt, MD  furosemide (LASIX) 20 MG tablet Take 1 tablet (20 mg total) by mouth daily. 03/20/16   Tanna Furry, MD  hydrochlorothiazide (HYDRODIURIL) 50 MG tablet Take 50 mg by mouth daily.    [provider]  losartan (COZAAR) 50 MG tablet Take 50 mg by mouth daily.    [provider]  Multiple Vitamin (MULITIVITAMIN WITH MINERALS) TABS Take 1 tablet by mouth daily.    [provider]  omeprazole (PRILOSEC) 20 MG capsule Take 20 mg by mouth daily.    [provider]  pregabalin (LYRICA) 50 MG capsule Take 50 mg by mouth 3 (three) times daily.    [provider]    Family History Family History  Problem Relation Age of Onset  . Hyperlipidemia Mother   . Hypertension Mother   . Other Mother        varicose veins  . Diabetes Father   . Hyperlipidemia  Father   . Hypertension Father   . Peripheral vascular disease Father   . Diabetes Sister   . Hyperlipidemia Sister   . Hypertension Sister   . Other Sister        varicose veins  . Hyperlipidemia Brother   . Hypertension Brother   . Peripheral vascular disease Brother   . Heart disease Daughter        before age 55    Social History Social History  Substance Use Topics  . Smoking status: Former Smoker    Types: Cigarettes    Quit date: 03/31/1969  . Smokeless tobacco: Current User  . Alcohol use Yes     Allergies   Clonazepam   Review of Systems Review of Systems  All other systems reviewed and are negative.    Physical Exam Updated Vital Signs BP (!) 153/52   Pulse 63   Temp 98 F  (36.7 C) (Oral)   Resp 16   Ht 5\' 1"  (1.549 m)   Wt 78.9 kg (174 lb)   SpO2 95%   BMI 32.88 kg/m   Physical Exam  Constitutional: She appears well-developed and well-nourished. No distress.  HENT:  Head: Atraumatic.  Eyes: Conjunctivae are normal.  Neck: Neck supple.  Cardiovascular: Normal rate, regular rhythm and intact distal pulses.   Pulmonary/Chest: Effort normal and breath sounds normal.  Musculoskeletal: She exhibits tenderness (Right lower extremity: 1+ pitting edema to tib-fib and dorsum of foot. Erythema and warmth noted to the dorsum of the distal tib-fib region with tenderness to palpation. Tenderness to the calf. Left lower extremity: 1+ pitting edema without tenderness ).  Neurological: She is alert.  Skin: No rash noted.  Psychiatric: She has a normal mood and affect.  Nursing note and vitals reviewed.    ED Treatments / Results  Labs (all labs ordered are listed, but only abnormal results are displayed) Labs Reviewed  BASIC METABOLIC PANEL - Abnormal; Notable for the following:       Result Value   Sodium 132 (*)    Potassium 3.2 (*)    Chloride 98 (*)    Glucose, Bld 101 (*)    All other components within normal limits  CBC - Abnormal; Notable for the following:    RBC 3.53 (*)    Hemoglobin 9.9 (*)    HCT 30.7 (*)    All other components within normal limits    EKG  EKG Interpretation None       Radiology No results found.  Procedures Procedures (including critical care time)  Medications Ordered in ED Medications - No data to display   Initial Impression / Assessment and Plan / ED Course  I have reviewed the triage vital signs and the nursing notes.  Pertinent labs & imaging results that were available during my care of the patient were reviewed by me and considered in my medical decision making (see chart for details).     BP (!) 130/58   Pulse (!) 54   Temp 98 F (36.7 C) (Oral)   Resp 16   Ht 5\' 1"  (1.549 m)   Wt 78.9 kg  (174 lb)   SpO2 96%   BMI 32.88 kg/m    Final Clinical Impressions(s) / ED Diagnoses   Final diagnoses:  Cellulitis of right leg without foot    New Prescriptions New Prescriptions   CEPHALEXIN (KEFLEX) 500 MG CAPSULE    Take 1 capsule (500 mg total) by mouth 3 (three) times daily.  7:53 AM Patient presents with pain, swelling, redness to right lower extremity suggestive of cellulitis. She does have trace edema in her left lower extremity without any erythema. Given prior history of cancer, will obtain a venous Doppler study to rule out DVT. If negative, patient will be treated for cellulitis with antibiotic. Low suspicion for CHF or renal disease causing her swelling.  9:42 AM DVT studies negative. Patient will be treated for cellulitis. Patient will receive vancomycin via IV while in the ER she will be discharged with Keflex.   Domenic Moras, PA-C 09/20/16 1109    Mesner, Corene Cornea, MD 09/20/16 1147

## 2016-09-20 NOTE — Discharge Instructions (Signed)
You have been diagnosed with cellulitis, a skin infection causing your right leg pain.  Please take antibiotic as prescribed for the full duration.  Follow up with your doctor in 48 hrs for recheck, return sooner if your condition worsen or if you have other concerns.

## 2016-09-20 NOTE — ED Triage Notes (Addendum)
Per EMS:  Pt presents to ED for assessment of bilateral leg swelling, chronic in nature, expressing pain to both legs.  Pt has a PCP check up yesterday with no concerns from PCP.  Pt states legs have been swelling more x 3 days.  No redness noted, +1 pitting edema noted, lung sounds clear.  Pt denies CP or SOB.

## 2016-09-20 NOTE — ED Provider Notes (Signed)
Medical screening examination/treatment/procedure(s) were conducted as a shared visit with non-physician practitioner(s) and myself.  I personally evaluated the patient during the encounter.  66 year old female who is here with a few days of progressively worsening right lower extremity swelling. On exam her right leg has 1+ edema to the knee. Her left leg has some trace edema around the ankles. Her right lower leg is also indurated, warm and slightly erythematous. DVT study was negative here. Likely just cellulitis but no evidence of sepsis or other systemic involvement so is stable for outpatient management.   Merrily Pew, MD 09/20/16 1148

## 2017-01-11 ENCOUNTER — Emergency Department (HOSPITAL_COMMUNITY)
Admission: EM | Admit: 2017-01-11 | Discharge: 2017-01-11 | Disposition: A | Discharge status: Home / Self Care | Payer: Medicare Other | Attending: Emergency Medicine | Admitting: Emergency Medicine

## 2017-01-11 ENCOUNTER — Encounter (HOSPITAL_COMMUNITY): Payer: Self-pay

## 2017-01-11 DIAGNOSIS — Z87891 Personal history of nicotine dependence: Secondary | ICD-10-CM | POA: Diagnosis not present

## 2017-01-11 DIAGNOSIS — I10 Essential (primary) hypertension: Secondary | ICD-10-CM | POA: Diagnosis not present

## 2017-01-11 DIAGNOSIS — J029 Acute pharyngitis, unspecified: Secondary | ICD-10-CM | POA: Insufficient documentation

## 2017-01-11 DIAGNOSIS — Z79899 Other long term (current) drug therapy: Secondary | ICD-10-CM | POA: Insufficient documentation

## 2017-01-11 DIAGNOSIS — Z7982 Long term (current) use of aspirin: Secondary | ICD-10-CM | POA: Insufficient documentation

## 2017-01-11 DIAGNOSIS — I2510 Atherosclerotic heart disease of native coronary artery without angina pectoris: Secondary | ICD-10-CM | POA: Insufficient documentation

## 2017-01-11 LAB — RAPID STREP SCREEN (MED CTR MEBANE ONLY): STREPTOCOCCUS, GROUP A SCREEN (DIRECT): NEGATIVE

## 2017-01-11 NOTE — ED Provider Notes (Signed)
[facility] DEPT Provider Note   CSN: [id number] Arrival date & time: 01/11/17  1356     History   Chief Complaint Chief Complaint  Patient presents with  . Sore Throat    HPI Haley Long is a 66 y.o. female.  HPI   Haley Long is a 66yo female with a history of hypertension, coronary artery disease, arthritis who presents to the emergency department complaining of sore throat which has been going on for the past 2 days. Patient states that she underwent an endoscopy procedure approximately 3 days ago, states that it was completely normal. Shortly after the procedure she developed "scratchy, dry" throat pain which is a 7/10 in severity and present when she swallows. She has tried salt water gargles without much relief. She states that she was told to expect to have some throat pain after the endoscopy procedure but wanted to make sure that she doesn't have strep throat. Thinks that she might of saw some white spots on her tonsils earlier today. Denies fever, trismus, voice change, dysphagia, congestion, ear pain, shortness of breath. No known sick contacts.  Past Medical History:  Diagnosis Date  . Arthritis   . Cancer ([place])    uterine  . Coronary artery disease   . Hypertension   . Irregular heart beat   . Varicose veins     Patient Active Problem List   Diagnosis Date Noted  . Varicose veins of lower extremities with other complications 16/96/7893  . Irregular heart beat     Past Surgical History:  Procedure Laterality Date  . ABDOMINAL HYSTERECTOMY    . CATARACT EXTRACTION, BILATERAL    . CHOLECYSTECTOMY    . ENDOVENOUS ABLATION SAPHENOUS VEIN W/ LASER Left 04-21-2013   left greater saphenous vein and sclerotherapy left leg  . PELVIC EXENTERATION      OB History    No data available       Home Medications    Prior to Admission medications   Medication Sig Start Date End Date Taking? Authorizing Provider  aspirin EC 81 MG tablet Take 81 mg by mouth  daily.    [provider]  cephALEXin (KEFLEX) 500 MG capsule Take 1 capsule (500 mg total) by mouth 3 (three) times daily. 09/20/16   Domenic Moras, PA-C  clonazePAM (KLONOPIN) 0.5 MG tablet Take 0.25 mg by mouth 2 (two) times daily.    [provider]  diphenhydrAMINE (BENADRYL) 25 MG tablet Take 1 tablet (25 mg total) by mouth every 6 (six) hours. Patient not taking: Reported on 09/20/2016 08/10/16   Jola Schmidt, MD  famotidine (PEPCID) 20 MG tablet Take 1 tablet (20 mg total) by mouth 2 (two) times daily. Patient not taking: Reported on 09/20/2016 08/10/16   Jola Schmidt, MD  furosemide (LASIX) 20 MG tablet Take 1 tablet (20 mg total) by mouth daily. Patient taking differently: Take 40 mg by mouth daily.  03/20/16   Tanna Furry, MD  HYDROcodone-acetaminophen (NORCO/VICODIN) 5-325 MG tablet Take 1 tablet by mouth 4 (four) times daily as needed for moderate pain.    [provider]  lisinopril-hydrochlorothiazide (PRINZIDE,ZESTORETIC) 10-12.5 MG tablet Take 1 tablet by mouth daily.    [provider]  Multiple Vitamin (MULITIVITAMIN WITH MINERALS) TABS Take 1 tablet by mouth daily.    [provider]  omeprazole (PRILOSEC) 20 MG capsule Take 20 mg by mouth daily.    [provider]  pregabalin (LYRICA) 75 MG capsule Take 75 mg by mouth 3 (three)  times daily.     [provider]    Family History Family History  Problem Relation Age of Onset  . Hyperlipidemia Mother   . Hypertension Mother   . Other Mother        varicose veins  . Diabetes Father   . Hyperlipidemia Father   . Hypertension Father   . Peripheral vascular disease Father   . Diabetes Sister   . Hyperlipidemia Sister   . Hypertension Sister   . Other Sister        varicose veins  . Hyperlipidemia Brother   . Hypertension Brother   . Peripheral vascular disease Brother   . Heart disease Daughter        before age 86    Social History Social History    Substance Use Topics  . Smoking status: Former Smoker    Types: Cigarettes    Quit date: 03/31/1969  . Smokeless tobacco: Current User  . Alcohol use Yes     Allergies   Clonazepam and Tramadol   Review of Systems Review of Systems  Constitutional: Negative for chills, fatigue and fever.  HENT: Positive for sore throat. Negative for congestion, ear discharge, ear pain, facial swelling, mouth sores, sinus pain, sinus pressure and trouble swallowing.   Respiratory: Negative for shortness of breath.   Cardiovascular: Negative for chest pain.     Physical Exam Updated Vital Signs BP (!) 165/57 (BP Location: Left Arm)   Pulse (!) 57   Temp 98.4 F (36.9 C) (Oral)   Resp 16   SpO2 98%   Physical Exam  Constitutional: She appears well-developed and well-nourished. No distress.  HENT:  Head: Normocephalic and atraumatic.  Right Ear: External ear normal.  Left Ear: External ear normal.  Nose: Nose normal.  Mouth/Throat: Oropharynx is clear and moist. No oropharyngeal exudate.  Bilateral TMs with good cone of light. Uvula midline. No trismus.   Eyes: Pupils are equal, round, and reactive to light. Conjunctivae are normal. Right eye exhibits no discharge. Left eye exhibits no discharge.  Neck: Normal range of motion. Neck supple.  Pulmonary/Chest: Effort normal. No respiratory distress.  Lymphadenopathy:    She has no cervical adenopathy.  Neurological: She is alert. Coordination normal.  Skin: She is not diaphoretic.  Psychiatric: She has a normal mood and affect. Her behavior is normal.  Nursing note and vitals reviewed.    ED Treatments / Results  Labs (all labs ordered are listed, but only abnormal results are displayed) Labs Reviewed  RAPID STREP SCREEN (NOT AT [facility])  CULTURE, GROUP A STREP [facility])    EKG  EKG Interpretation None       Radiology No results found.  Procedures Procedures (including critical care time)  Medications Ordered in  ED Medications - No data to display   Initial Impression / Assessment and Plan / ED Course  I have reviewed the triage vital signs and the nursing notes.  Pertinent labs & imaging results that were available during my care of the patient were reviewed by me and considered in my medical decision making (see chart for details).     Pt afebrile without tonsillar exudate, negative strep. Presents with "scratchy, dry" odynophagia, likely related to her recent endoscopy procedure vs viral pharyngitis.  No abx indicated. Discharged with symptomatic tx for pain  Pt does not appear dehydrated, but did discuss importance of water rehydration. Presentation non concerning for PTA or RPA. No trismus or uvula deviation. Specific return precautions discussed. Pt  able to drink water in ED without difficulty with intact air way. Recommended PCP follow up.  Final Clinical Impressions(s) / ED Diagnoses   Final diagnoses:  Sore throat    New Prescriptions New Prescriptions   No medications on file     Bernarda Caffey 01/11/17 1649    Blanchie Dessert, MD 01/11/17 1921

## 2017-01-11 NOTE — Discharge Instructions (Signed)
Your test for strep throat was negative. Your sore throat is most likely related to your recent endoscopy procedure, we will manage this symptomatically.  Please use over-the-counter throat lozenges, salt water gargles, warm liquids to the back of the throat, honey to help improve the sore throat.  No antibiotics are indicated as her test was negative for strep throat.  Keep your appointment with your primary doctor for October 24 and follow up with her if symptoms have not improved.  Please return to the emergency department if you develop a fever with sore throat, are unable to swallow due to throat pain, have new or worsening neck pain associated with your sore throat. Please also return for any new or worsening symptoms.

## 2017-01-11 NOTE — ED Triage Notes (Signed)
Patient complains of sore throat with white patches to same x 4 days, denies fever. NAD

## 2017-01-11 NOTE — ED Notes (Signed)
See EDP secondary assessment.  

## 2017-01-13 LAB — CULTURE, GROUP A STREP (THRC)

## 2017-04-10 ENCOUNTER — Emergency Department (HOSPITAL_COMMUNITY)
Admission: EM | Admit: 2017-04-10 | Discharge: 2017-04-10 | Disposition: A | Discharge status: Home / Self Care | Payer: Medicare Other | Attending: Emergency Medicine | Admitting: Emergency Medicine

## 2017-04-10 ENCOUNTER — Encounter (HOSPITAL_COMMUNITY): Payer: Self-pay | Admitting: Emergency Medicine

## 2017-04-10 ENCOUNTER — Emergency Department (HOSPITAL_COMMUNITY): Payer: Medicare Other

## 2017-04-10 DIAGNOSIS — J029 Acute pharyngitis, unspecified: Secondary | ICD-10-CM | POA: Insufficient documentation

## 2017-04-10 DIAGNOSIS — M542 Cervicalgia: Secondary | ICD-10-CM | POA: Diagnosis not present

## 2017-04-10 DIAGNOSIS — I2510 Atherosclerotic heart disease of native coronary artery without angina pectoris: Secondary | ICD-10-CM | POA: Diagnosis not present

## 2017-04-10 DIAGNOSIS — Y929 Unspecified place or not applicable: Secondary | ICD-10-CM | POA: Insufficient documentation

## 2017-04-10 DIAGNOSIS — Y33XXXA Other specified events, undetermined intent, initial encounter: Secondary | ICD-10-CM | POA: Insufficient documentation

## 2017-04-10 DIAGNOSIS — Y999 Unspecified external cause status: Secondary | ICD-10-CM | POA: Diagnosis not present

## 2017-04-10 DIAGNOSIS — Y939 Activity, unspecified: Secondary | ICD-10-CM | POA: Insufficient documentation

## 2017-04-10 DIAGNOSIS — I10 Essential (primary) hypertension: Secondary | ICD-10-CM | POA: Diagnosis not present

## 2017-04-10 DIAGNOSIS — R0981 Nasal congestion: Secondary | ICD-10-CM | POA: Diagnosis not present

## 2017-04-10 DIAGNOSIS — S00411A Abrasion of right ear, initial encounter: Secondary | ICD-10-CM | POA: Diagnosis not present

## 2017-04-10 DIAGNOSIS — F17220 Nicotine dependence, chewing tobacco, uncomplicated: Secondary | ICD-10-CM | POA: Diagnosis not present

## 2017-04-10 DIAGNOSIS — H9221 Otorrhagia, right ear: Secondary | ICD-10-CM | POA: Diagnosis present

## 2017-04-10 DIAGNOSIS — Z8541 Personal history of malignant neoplasm of cervix uteri: Secondary | ICD-10-CM | POA: Insufficient documentation

## 2017-04-10 LAB — RAPID STREP SCREEN (MED CTR MEBANE ONLY): STREPTOCOCCUS, GROUP A SCREEN (DIRECT): NEGATIVE

## 2017-04-10 MED ORDER — NEOMYCIN-POLYMYXIN-HC 3.5-10000-1 OT SOLN: 3.0000 [drp] | Freq: Four times a day (QID) | OTIC | 0 refills | Status: AC

## 2017-04-10 MED ORDER — GUAIFENESIN ER 600 MG PO TB12: 600.0000 mg | ORAL_TABLET | Freq: Two times a day (BID) | ORAL | 0 refills | Status: AC

## 2017-04-10 MED ORDER — ACETAMINOPHEN 325 MG PO TABS: 650.0000 mg | ORAL_TABLET | Freq: Once | ORAL | Status: DC

## 2017-04-10 NOTE — ED Triage Notes (Addendum)
Pt states for the last 3 or 4 weeks she has had pain in the back of her neck able to move head up and down but increase pain with side to side movement. Pt called ems after getting out of the shower she was cleaning her ear with a Qtip and pulled it out and was saturated in red blood and then had a nose bleed. No bleeding at this time. History of arthritis.

## 2017-04-10 NOTE — ED Notes (Signed)
Patient still waiting in [place].

## 2017-04-10 NOTE — ED Notes (Signed)
See EDP secondary assessment.  

## 2017-04-10 NOTE — ED Notes (Signed)
Patient ambulated to restroom independently.

## 2017-04-10 NOTE — ED Provider Notes (Signed)
[place] EMERGENCY DEPARTMENT Provider Note   CSN: [id number] Arrival date & time: 04/10/17  1052     History   Chief Complaint Chief Complaint  Patient presents with  . Neck Pain    HPI Haley Long is a 67 y.o. female.  HPI   Patient is a 67 year old female with a history of uterine cancer (NED for 11 years), irregular heartbeat (patient unable to identify name, not on [facility]), hypertension, and arthritis presenting for bleeding in her right ear this morning, sinus congestion, and neck pain.  Patient presents with her care aide.  Patient reports she cleans out her ears daily with Q-tips and noted that there was some blood extruding out of the right ear this morning.  It is since stopped.  Patient reports she occasionally gets nosebleeds.  Patient does take aspirin but no other blood thinners.  Patient does report that for 1 week, she has had bilateral nasal congestion and maxillary sinus pressure.  Patient is also noting she has had a sore throat for 1 week.  No difficulty breathing or swallowing.  Regarding patient's neck pain, patient reports she has had approximately 4 weeks of neck pain that is in the cervical paraspinal musculature.  No antecedent fall or neck trauma.  No antecedent injury.  No weakness or numbness of the upper extremities.  Pain is aching and it feels like "a crick" in her neck.  Patient denies any known history of cervical degenerative disease disease or cervical radiculopathy.  Past Medical History:  Diagnosis Date  . Arthritis   . Cancer ([place])    uterine  . Coronary artery disease   . Hypertension   . Irregular heart beat   . Varicose veins     Patient Active Problem List   Diagnosis Date Noted  . Varicose veins of lower extremities with other complications 26/37/8588  . Irregular heart beat     Past Surgical History:  Procedure Laterality Date  . ABDOMINAL HYSTERECTOMY    . CATARACT EXTRACTION, BILATERAL    .  CHOLECYSTECTOMY    . ENDOVENOUS ABLATION SAPHENOUS VEIN W/ LASER Left 04-21-2013   left greater saphenous vein and sclerotherapy left leg  . PELVIC EXENTERATION      OB History    No data available       Home Medications    Prior to Admission medications   Medication Sig Start Date End Date Taking? Authorizing Provider  aspirin EC 81 MG tablet Take 81 mg by mouth daily.    [provider]  cephALEXin (KEFLEX) 500 MG capsule Take 1 capsule (500 mg total) by mouth 3 (three) times daily. 09/20/16   Domenic Moras, PA-C  clonazePAM (KLONOPIN) 0.5 MG tablet Take 0.25 mg by mouth 2 (two) times daily.    [provider]  diphenhydrAMINE (BENADRYL) 25 MG tablet Take 1 tablet (25 mg total) by mouth every 6 (six) hours. Patient not taking: Reported on 09/20/2016 08/10/16   Jola Schmidt, MD  famotidine (PEPCID) 20 MG tablet Take 1 tablet (20 mg total) by mouth 2 (two) times daily. Patient not taking: Reported on 09/20/2016 08/10/16   Jola Schmidt, MD  furosemide (LASIX) 20 MG tablet Take 1 tablet (20 mg total) by mouth daily. Patient taking differently: Take 40 mg by mouth daily.  03/20/16   Tanna Furry, MD  guaiFENesin (MUCINEX) 600 MG 12 hr tablet Take 1 tablet (600 mg total) by mouth 2 (two) times daily for 7 days. 04/10/17 04/17/17  Langston Masker B, PA-C  HYDROcodone-acetaminophen (NORCO/VICODIN) 5-325 MG tablet Take 1 tablet by mouth 4 (four) times daily as needed for moderate pain.    [provider]  lisinopril-hydrochlorothiazide (PRINZIDE,ZESTORETIC) 10-12.5 MG tablet Take 1 tablet by mouth daily.    [provider]  Multiple Vitamin (MULITIVITAMIN WITH MINERALS) TABS Take 1 tablet by mouth daily.    [provider]  neomycin-polymyxin-hydrocortisone (CORTISPORIN) OTIC solution Place 3 drops into the right ear 4 (four) times daily for 7 days. 04/10/17 04/17/17  Langston Masker B, PA-C  omeprazole (PRILOSEC) 20 MG capsule Take 20 mg by mouth daily.     [provider]  pregabalin (LYRICA) 75 MG capsule Take 75 mg by mouth 3 (three) times daily.     [provider]    Family History Family History  Problem Relation Age of Onset  . Hyperlipidemia Mother   . Hypertension Mother   . Other Mother        varicose veins  . Diabetes Father   . Hyperlipidemia Father   . Hypertension Father   . Peripheral vascular disease Father   . Diabetes Sister   . Hyperlipidemia Sister   . Hypertension Sister   . Other Sister        varicose veins  . Hyperlipidemia Brother   . Hypertension Brother   . Peripheral vascular disease Brother   . Heart disease Daughter        before age 15    Social History Social History   Tobacco Use  . Smoking status: Former Smoker    Types: Cigarettes    Last attempt to quit: 03/31/1969    Years since quitting: 48.0  . Smokeless tobacco: Current User  Substance Use Topics  . Alcohol use: Yes  . Drug use: No     Allergies   Clonazepam and Tramadol   Review of Systems Review of Systems  Constitutional: Negative for chills and fever.  HENT: Positive for congestion, ear discharge, ear pain and sore throat. Negative for rhinorrhea, trouble swallowing and voice change.   Respiratory: Negative for cough, wheezing and stridor.   Gastrointestinal: Negative for nausea and vomiting.  Musculoskeletal: Positive for arthralgias and neck pain.     Physical Exam Updated Vital Signs BP (!) 141/70   Pulse 78   Temp 98.2 F (36.8 C) (Oral)   Resp 16   Ht 5\' 1"  (1.549 m)   Wt 73.5 kg (162 lb)   SpO2 97%   BMI 30.61 kg/m   Physical Exam  Constitutional: She appears well-developed and well-nourished. No distress.  Sitting comfortably in bed.  HENT:  Head: Normocephalic and atraumatic.  Throat exam: Normal phonation. No muffled voice sounds. Patient swallows secretions without difficulty. Dentition normal. No lesions of tongue or buccal mucosa. Uvula midline. No asymmetric swelling of the  posterior pharynx. No Erythema of posterior pharynx. No tonsillar exuduate. No lingual swelling. No induration inferior to tongue. No submandibular tenderness, swelling, or induration.  Tissues of the neck supple. No cervical lymphadenopathy.  Right Ear Exam: Right external ear is without erythema of the auricle.  There is a small abrasion in the right external auditory canal.  There is minimal erythema within the canal.  Tympanic membrane is intact without evident evidence of effusion.  Eyes: Conjunctivae are normal. Right eye exhibits no discharge. Left eye exhibits no discharge.  EOMs normal to gross examination.  Neck: Normal range of motion.  Cardiovascular: Normal rate, regular rhythm and normal heart sounds.  Intact, 2+ radial pulse bilaterally.  Pulmonary/Chest: Effort normal and breath sounds normal.  Normal respiratory effort. Patient converses comfortably. No audible wheeze or stridor.  Abdominal: She exhibits no distension.  Musculoskeletal: Normal range of motion.  INSPECTION: Cervical spine demonstrates a significant loss of lordosis. PALPATION: No midline but paraspinal musculature tenderness of cervical and thoracic spine.  No crepitus.  No step-off. ROM of cervical spine intact with flexion/extension/lateral flexion/lateral rotation; Patient can laterally rotate cervical spine greater than 45 degrees.  MOTOR: 5/5 strength b/l with resisted shoulder abduction/adduction, biceps flexion (C5/6), biceps extension (C6-C8), wrist flexion, wrist extension (C6-C8), and grip strength (C7-T1) 2+ DTRs in the biceps and triceps SENSORY: Sensation is intact to light touch in:  Superficial radial nerve distribution (dorsal first web space) Median nerve distribution (tip of index finger)   Ulnar nerve distribution (tip of small finger)   Neurological: She is alert.  Cranial nerves intact to gross observation. Patient moves extremities without difficulty.  Skin: Skin is warm and dry. She is  not diaphoretic.  Psychiatric: She has a normal mood and affect. Her behavior is normal. Judgment and thought content normal.  Nursing note and vitals reviewed.    ED Treatments / Results  Labs (all labs ordered are listed, but only abnormal results are displayed) Labs Reviewed  RAPID STREP SCREEN (NOT AT [facility])  CULTURE, GROUP A STREP [facility])    EKG  EKG Interpretation None       Radiology Dg Cervical Spine Complete  Result Date: 04/10/2017 CLINICAL DATA:  Neck and upper back pain for 3-4 weeks. EXAM: CERVICAL SPINE - COMPLETE 4+ VIEW COMPARISON:  PET-CT 06/30/2006 FINDINGS: There is no evidence of cervical spine fracture or prevertebral soft tissue swelling. Reversal of cervical lordosis. Multilevel osteoarthritic changes of the cervical spine with disc space narrowing, mild discogenic sclerosis and remodeling of vertebral bodies, and posterior facet arthropathy. Mild bilateral neural foraminal narrowing at C4-C5 and C5-C6. IMPRESSION: Multilevel osteoarthritic changes of the cervical spine, moderate. Mild bilateral osseous neural foraminal narrowing at C4-C5 and C5-C6. Electronically Signed   By: Fidela Salisbury M.D.   On: 04/10/2017 18:35   Dg Thoracic Spine 2 View  Result Date: 04/10/2017 CLINICAL DATA:  Neck and upper back pain for the past 3-4 weeks with limited movement. No reported injury. EXAM: THORACIC SPINE 2 VIEWS COMPARISON:  Cervical spine radiographs obtained at the same time. FINDINGS: Mild anterior spur formation at multiple levels. No fractures or subluxations. Upper abdominal surgical clips. IMPRESSION: Mild multilevel degenerative changes.  No acute abnormality. Electronically Signed   By: Claudie Revering M.D.   On: 04/10/2017 18:33    Procedures Procedures (including critical care time)  Medications Ordered in ED Medications - No data to display   Initial Impression / Assessment and Plan / ED Course  I have reviewed the triage vital signs and the nursing  notes.  Pertinent labs & imaging results that were available during my care of the patient were reviewed by me and considered in my medical decision making (see chart for details).     Final Clinical Impressions(s) / ED Diagnoses   Final diagnoses:  Abrasion of right ear canal, initial encounter  Neck pain  Sore throat   Patient is nontoxic-appearing, afebrile, and in no acute distress.  Patient's right ear exhibits an abrasion without evidence of infection today.  Will treat with Cortisporin drops to reduce inflammation and prevent infection.  Rapid strep is negative.  Suspect viral etiology for patient's sinus congestion and  sore throat.  Patient does have a history of recurrent nosebleeds, so will defer nasal steroid but treat with Mucinex.  Patient's neck pain is chronic in nature and without concerning features for acute cervical spine compression.  Suspect that patient's neck pain is due to torticollis due to degenerative disc disease in the cervical spine, as identified on x-ray as well as loss of normal lordosis of the cervical spine.  I discussed follow-up with primary care provider and orthopedic or neurosurgery referral if symptoms persist.  Patient is treated chronically on hydrocodone therapy, and will defer to this pain management regimen for management of neck pain.  Patient instructed to return to the emergency department for any new or worsening symptoms.  Patient and her care aide are in understanding and agree with the plan of care.  This is a shared visit with dr. Nat Christen. Patient was independently evaluated by this attending physician. Attending physician consulted in evaluation and discharge management.  ED Discharge Orders        Ordered    neomycin-polymyxin-hydrocortisone (CORTISPORIN) OTIC solution  4 times daily     04/10/17 2041    guaiFENesin (MUCINEX) 600 MG 12 hr tablet  2 times daily     04/10/17 2042       Tamala Julian 04/11/17 0303      Nat Christen, MD 04/13/17 [phone]

## 2017-04-10 NOTE — ED Notes (Signed)
Patient transported to X-ray via wheelchair 

## 2017-04-10 NOTE — Discharge Instructions (Signed)
Please see the information and instructions below regarding your visit.  Your diagnoses today include:  1. Abrasion of right ear canal, initial encounter   2. Neck pain    Your examination is reassuring today.  Your eardrum is intact.  I believe that you have a abrasion in your ear.  Your neck x-rays and examination of your neck are reassuring today.  This can be followed by her primary care provider.  He may need to see orthopedics or neurosurgery if you are having symptoms that are consistent with neck strain or nerve pinching in the neck.  Tests performed today include: See side panel of your discharge paperwork for testing performed today. Vital signs are listed at the bottom of these instructions.   X-rays of the neck and upper back.  Medications prescribed:    Take any prescribed medications only as prescribed, and any over the counter medications only as directed on the packaging.  Please place 3 drops of the ear drop in your right ear 4 times daily for 5-7 days.  This should help heal up the abrasion and prevent infection.  Home care instructions:  Please follow any educational materials contained in this packet.   Follow-up instructions: Please follow-up with your primary care provider in next week for further evaluation of your symptoms if they are not completely improved.   Return instructions:  Please return to the Emergency Department if you experience worsening symptoms.  Please return to the emergency department for any worsening pain in your neck, weakness or numbness in your arms or hands, fevers or chills with your symptoms. Please return to the emergency department if you have any swelling the back your throat, difficulty breathing, difficulty swallowing. Please see your primary care provider if you have any increasing drainage out of your ear or any decreased hearing. Please return if you have any other emergent concerns.  Additional Information:   Your vital  signs today were: BP (!) 137/97 (BP Location: Left Arm)    Pulse 84    Temp 98.2 F (36.8 C) (Oral)    Resp 16    Ht 5\' 1"  (1.549 m)    Wt 73.5 kg (162 lb)    SpO2 100%    BMI 30.61 kg/m  If your blood pressure (BP) was elevated on multiple readings during this visit above 130 for the top number or above 80 for the bottom number, please have this repeated by your primary care provider within one month. --------------  Thank you for allowing [place] to participate in your care today.

## 2017-04-13 LAB — CULTURE, GROUP A STREP (THRC)

## 2017-05-12 ENCOUNTER — Emergency Department (HOSPITAL_COMMUNITY)
Admission: EM | Admit: 2017-05-12 | Discharge: 2017-05-12 | Disposition: A | Discharge status: Home / Self Care | Payer: Medicare Other | Attending: Emergency Medicine | Admitting: Emergency Medicine

## 2017-05-12 ENCOUNTER — Other Ambulatory Visit: Payer: Self-pay

## 2017-05-12 ENCOUNTER — Emergency Department (HOSPITAL_BASED_OUTPATIENT_CLINIC_OR_DEPARTMENT_OTHER): Admit: 2017-05-12 | Discharge: 2017-05-12 | Disposition: A | Discharge status: Home / Self Care | Payer: Medicare Other

## 2017-05-12 ENCOUNTER — Encounter (HOSPITAL_COMMUNITY): Payer: Self-pay | Admitting: Emergency Medicine

## 2017-05-12 DIAGNOSIS — Z7982 Long term (current) use of aspirin: Secondary | ICD-10-CM | POA: Insufficient documentation

## 2017-05-12 DIAGNOSIS — Z79899 Other long term (current) drug therapy: Secondary | ICD-10-CM | POA: Insufficient documentation

## 2017-05-12 DIAGNOSIS — I2510 Atherosclerotic heart disease of native coronary artery without angina pectoris: Secondary | ICD-10-CM | POA: Diagnosis not present

## 2017-05-12 DIAGNOSIS — I119 Hypertensive heart disease without heart failure: Secondary | ICD-10-CM | POA: Insufficient documentation

## 2017-05-12 DIAGNOSIS — Z87891 Personal history of nicotine dependence: Secondary | ICD-10-CM | POA: Diagnosis not present

## 2017-05-12 DIAGNOSIS — R609 Edema, unspecified: Secondary | ICD-10-CM | POA: Diagnosis not present

## 2017-05-12 DIAGNOSIS — R2242 Localized swelling, mass and lump, left lower limb: Secondary | ICD-10-CM | POA: Insufficient documentation

## 2017-05-12 DIAGNOSIS — R2241 Localized swelling, mass and lump, right lower limb: Secondary | ICD-10-CM | POA: Diagnosis not present

## 2017-05-12 DIAGNOSIS — M79606 Pain in leg, unspecified: Secondary | ICD-10-CM | POA: Diagnosis present

## 2017-05-12 DIAGNOSIS — R600 Localized edema: Secondary | ICD-10-CM

## 2017-05-12 LAB — CBC WITH DIFFERENTIAL/PLATELET
Basophils Absolute: 0 10*3/uL (ref 0.0–0.1)
Basophils Relative: 1 %
EOS ABS: 0.2 10*3/uL (ref 0.0–0.7)
Eosinophils Relative: 5 %
HCT: 33.6 % — ABNORMAL LOW (ref 36.0–46.0)
Hemoglobin: 10.8 g/dL — ABNORMAL LOW (ref 12.0–15.0)
LYMPHS ABS: 1.4 10*3/uL (ref 0.7–4.0)
LYMPHS PCT: 29 %
MCH: 29 pg (ref 26.0–34.0)
MCHC: 32.1 g/dL (ref 30.0–36.0)
MCV: 90.1 fL (ref 78.0–100.0)
MONO ABS: 0.5 10*3/uL (ref 0.1–1.0)
Monocytes Relative: 9 %
Neutro Abs: 2.7 10*3/uL (ref 1.7–7.7)
Neutrophils Relative %: 56 %
PLATELETS: 249 10*3/uL (ref 150–400)
RBC: 3.73 MIL/uL — AB (ref 3.87–5.11)
RDW: 13 % (ref 11.5–15.5)
WBC: 4.8 10*3/uL (ref 4.0–10.5)

## 2017-05-12 LAB — COMPREHENSIVE METABOLIC PANEL
ALT: 18 U/L (ref 14–54)
AST: 23 U/L (ref 15–41)
Albumin: 4 g/dL (ref 3.5–5.0)
Alkaline Phosphatase: 42 U/L (ref 38–126)
Anion gap: 10 (ref 5–15)
BUN: 11 mg/dL (ref 6–20)
CALCIUM: 9.7 mg/dL (ref 8.9–10.3)
CO2: 26 mmol/L (ref 22–32)
CREATININE: 0.69 mg/dL (ref 0.44–1.00)
Chloride: 102 mmol/L (ref 101–111)
GFR calc Af Amer: >60 mL/min (ref >60)
Glucose, Bld: 98 mg/dL (ref 65–99)
Potassium: 3.7 mmol/L (ref 3.5–5.1)
SODIUM: 138 mmol/L (ref 135–145)
TOTAL PROTEIN: 6.5 g/dL (ref 6.5–8.1)
Total Bilirubin: 0.8 mg/dL (ref 0.3–1.2)

## 2017-05-12 NOTE — Progress Notes (Signed)
*  Preliminary Results* Bilateral lower extremity venous duplex completed. Bilateral lower extremities are negative for deep vein thrombosis. There is no evidence of Baker's cyst bilaterally.  05/12/2017 7:40 PM Maudry Mayhew, BS, RVT, RDCS, RDMS

## 2017-05-12 NOTE — Discharge Instructions (Signed)
Follow-up with your primary doctor.  Return here as needed.  Remember to keep legs elevated when sitting at home.  Apply the bacitracin to the open area on your leg 3 times a day

## 2017-05-12 NOTE — ED Triage Notes (Signed)
bilateral l lower leg swelling and tightness, feet feel tight and sore x 2 days ,  states feels sick, nauseous , no cp or sob

## 2017-05-12 NOTE — ED Provider Notes (Addendum)
[place] EMERGENCY DEPARTMENT Provider Note   CSN: [id number] Arrival date & time: 05/12/17  1208     History   Chief Complaint Chief Complaint  Patient presents with  . Leg Pain    HPI Haley Long is a 67 y.o. female.  HPI Patient presents to the emergency department with bilateral lower extremity edema over the last few days.  The patient states that she has had swelling in her lower legs before but her feet feel tight.  Patient states that nothing seems to make the condition better or worse.  She states that she does take Lasix for swelling.  Patient states that she has been feeling nauseous as well.  The patient denies chest pain, shortness of breath, headache,blurred vision, neck pain, fever, cough, weakness, numbness, dizziness, anorexia,abdominal pain,  vomiting, diarrhea, rash, back pain, dysuria, hematemesis, bloody stool, near syncope, or syncope. Past Medical History:  Diagnosis Date  . Arthritis   . Cancer ([place])    uterine  . Coronary artery disease   . Hypertension   . Irregular heart beat   . Varicose veins     Patient Active Problem List   Diagnosis Date Noted  . Varicose veins of lower extremities with other complications 95/62/1308  . Irregular heart beat     Past Surgical History:  Procedure Laterality Date  . ABDOMINAL HYSTERECTOMY    . CATARACT EXTRACTION, BILATERAL    . CHOLECYSTECTOMY    . ENDOVENOUS ABLATION SAPHENOUS VEIN W/ LASER Left 04-21-2013   left greater saphenous vein and sclerotherapy left leg  . PELVIC EXENTERATION      OB History    No data available       Home Medications    Prior to Admission medications   Medication Sig Start Date End Date Taking? Authorizing Provider  aspirin EC 81 MG tablet Take 81 mg by mouth daily.   Yes [provider]  diphenhydrAMINE (BENADRYL) 25 MG tablet Take 1 tablet (25 mg total) by mouth every 6 (six) hours. 08/10/16  Yes Jola Schmidt, MD  famotidine  (PEPCID) 20 MG tablet Take 1 tablet (20 mg total) by mouth 2 (two) times daily. 08/10/16  Yes Jola Schmidt, MD  ferrous sulfate 325 (65 FE) MG tablet Take 1 tablet by mouth daily.   Yes [provider]  furosemide (LASIX) 20 MG tablet Take 1 tablet (20 mg total) by mouth daily. Patient taking differently: Take 40 mg by mouth daily.  03/20/16  Yes Tanna Furry, MD  HYDROcodone-acetaminophen (NORCO/VICODIN) 5-325 MG tablet Take 1 tablet by mouth 4 (four) times daily as needed for moderate pain.   Yes [provider]  lisinopril-hydrochlorothiazide (PRINZIDE,ZESTORETIC) 10-12.5 MG tablet Take 1 tablet by mouth daily.   Yes [provider]  Multiple Vitamin (MULITIVITAMIN WITH MINERALS) TABS Take 1 tablet by mouth daily.   Yes [provider]  omeprazole (PRILOSEC) 20 MG capsule Take 20 mg by mouth daily.   Yes [provider]  pregabalin (LYRICA) 75 MG capsule Take 75 mg by mouth 3 (three) times daily as needed.    Yes [provider]  cephALEXin (KEFLEX) 500 MG capsule Take 1 capsule (500 mg total) by mouth 3 (three) times daily. Patient not taking: Reported on 05/12/2017 09/20/16   Domenic Moras, PA-C    Family History Family History  Problem Relation Age of Onset  . Hyperlipidemia Mother   . Hypertension Mother   . Other Mother  varicose veins  . Diabetes Father   . Hyperlipidemia Father   . Hypertension Father   . Peripheral vascular disease Father   . Diabetes Sister   . Hyperlipidemia Sister   . Hypertension Sister   . Other Sister        varicose veins  . Hyperlipidemia Brother   . Hypertension Brother   . Peripheral vascular disease Brother   . Heart disease Daughter        before age 50    Social History Social History   Tobacco Use  . Smoking status: Former Smoker    Types: Cigarettes    Last attempt to quit: 03/31/1969    Years since quitting: 48.1  . Smokeless tobacco: Current User  Substance Use Topics  .  Alcohol use: Yes  . Drug use: No     Allergies   Clonazepam and Tramadol   Review of Systems Review of Systems All other systems negative except as documented in the HPI. All pertinent positives and negatives as reviewed in the HPI.  Physical Exam Updated Vital Signs BP (!) 145/78   Pulse (!) 59   Temp 98.3 F (36.8 C) (Oral)   Resp 16   SpO2 97%   Physical Exam  Constitutional: She is oriented to person, place, and time. She appears well-developed and well-nourished. No distress.  HENT:  Head: Normocephalic and atraumatic.  Mouth/Throat: Oropharynx is clear and moist.  Eyes: Pupils are equal, round, and reactive to light.  Neck: Normal range of motion. Neck supple.  Cardiovascular: Normal rate, regular rhythm and normal heart sounds. Exam reveals no gallop and no friction rub.  No murmur heard. Pulmonary/Chest: Effort normal and breath sounds normal. No respiratory distress. She has no wheezes.  Musculoskeletal: She exhibits edema.  Bilateral lower extremity edema 2+.  Neurological: She is alert and oriented to person, place, and time. She exhibits normal muscle tone. Coordination normal.  Skin: Skin is warm and dry. Capillary refill takes less than 2 seconds. No rash noted. No erythema.  Psychiatric: She has a normal mood and affect. Her behavior is normal.  Nursing note and vitals reviewed.    ED Treatments / Results  Labs (all labs ordered are listed, but only abnormal results are displayed) Labs Reviewed  CBC WITH DIFFERENTIAL/PLATELET - Abnormal; Notable for the following components:      Result Value   RBC 3.73 (*)    Hemoglobin 10.8 (*)    HCT 33.6 (*)    All other components within normal limits  COMPREHENSIVE METABOLIC PANEL    EKG  EKG Interpretation None       Radiology No results found.  Procedures Procedures (including critical care time)  Medications Ordered in ED Medications - No data to display   Initial Impression / Assessment  and Plan / ED Course  I have reviewed the triage vital signs and the nursing notes.  Pertinent labs & imaging results that were available during my care of the patient were reviewed by me and considered in my medical decision making (see chart for details).     Patient will be referred back to her primary care doctor.  Told to keep her legs elevated when sitting at home.  Advised her to return here as needed.  Patient does not have any findings that would be consistent with cellulitis at this point.  Patient is concerned about this.  The patient is advised to return for any worsening in her condition.  The patient has no  signs of blood clot at this time.  Final Clinical Impressions(s) / ED Diagnoses   Final diagnoses:  None    ED Discharge Orders    None       Rebeca Allegra 05/12/17 2243    Pattricia Boss, MD 05/16/17 1644    Wilmer Berryhill, [place], PA-C 05/25/17 1559    Pattricia Boss, MD 05/25/17 [phone]

## 2017-07-19 ENCOUNTER — Encounter (HOSPITAL_COMMUNITY): Payer: Self-pay | Admitting: *Deleted

## 2017-07-19 ENCOUNTER — Emergency Department (HOSPITAL_COMMUNITY)
Admission: EM | Admit: 2017-07-19 | Discharge: 2017-07-19 | Disposition: A | Discharge status: Home / Self Care | Payer: Medicare Other | Attending: Physician Assistant | Admitting: Physician Assistant

## 2017-07-19 ENCOUNTER — Other Ambulatory Visit: Payer: Self-pay

## 2017-07-19 DIAGNOSIS — Z8542 Personal history of malignant neoplasm of other parts of uterus: Secondary | ICD-10-CM | POA: Insufficient documentation

## 2017-07-19 DIAGNOSIS — Z87891 Personal history of nicotine dependence: Secondary | ICD-10-CM | POA: Insufficient documentation

## 2017-07-19 DIAGNOSIS — K0889 Other specified disorders of teeth and supporting structures: Secondary | ICD-10-CM | POA: Insufficient documentation

## 2017-07-19 DIAGNOSIS — I10 Essential (primary) hypertension: Secondary | ICD-10-CM | POA: Diagnosis not present

## 2017-07-19 DIAGNOSIS — I2510 Atherosclerotic heart disease of native coronary artery without angina pectoris: Secondary | ICD-10-CM | POA: Insufficient documentation

## 2017-07-19 DIAGNOSIS — Z7982 Long term (current) use of aspirin: Secondary | ICD-10-CM | POA: Insufficient documentation

## 2017-07-19 DIAGNOSIS — Z79899 Other long term (current) drug therapy: Secondary | ICD-10-CM | POA: Insufficient documentation

## 2017-07-19 MED ORDER — PENICILLIN V POTASSIUM 500 MG PO TABS: 500.0000 mg | ORAL_TABLET | Freq: Four times a day (QID) | ORAL | 0 refills | Status: AC

## 2017-07-19 NOTE — ED Provider Notes (Signed)
[facility] EMERGENCY DEPARTMENT Provider Note   CSN: [id number] Arrival date & time: 07/19/17  1504     History   Chief Complaint Chief Complaint  Patient presents with  . Dental Problem    HPI Haley Long is a 67 y.o. female.  The history is provided by the patient and medical records. No language interpreter was used.    Haley Long is a 67 y.o. female who presents to the Emergency Department complaining of constant gradually worsening right lower dental pain which began yesterday.  No medications taken prior to arrival for her symptoms.  Patient states that she missed her last appointment about 2 months ago.  This was scheduled to extract the tooth that is bothering her.  She has not rescheduled.  Patient denies facial swelling, neck pain, fever, chills, difficulty breathing or difficulty swallowing.    Past Medical History:  Diagnosis Date  . Arthritis   . Cancer ([facility])    uterine  . Coronary artery disease   . Hypertension   . Irregular heart beat   . Varicose veins     Patient Active Problem List   Diagnosis Date Noted  . Varicose veins of lower extremities with other complications 03/50/0938  . Irregular heart beat     Past Surgical History:  Procedure Laterality Date  . ABDOMINAL HYSTERECTOMY    . CATARACT EXTRACTION, BILATERAL    . CHOLECYSTECTOMY    . ENDOVENOUS ABLATION SAPHENOUS VEIN W/ LASER Left 04-21-2013   left greater saphenous vein and sclerotherapy left leg  . PELVIC EXENTERATION       OB History   None      Home Medications    Prior to Admission medications   Medication Sig Start Date End Date Taking? Authorizing Provider  aspirin EC 81 MG tablet Take 81 mg by mouth daily.    [provider]  cephALEXin (KEFLEX) 500 MG capsule Take 1 capsule (500 mg total) by mouth 3 (three) times daily. Patient not taking: Reported on 05/12/2017 09/20/16   Domenic Moras, PA-C  diphenhydrAMINE (BENADRYL) 25 MG tablet  Take 1 tablet (25 mg total) by mouth every 6 (six) hours. 08/10/16   Jola Schmidt, MD  famotidine (PEPCID) 20 MG tablet Take 1 tablet (20 mg total) by mouth 2 (two) times daily. 08/10/16   Jola Schmidt, MD  ferrous sulfate 325 (65 FE) MG tablet Take 1 tablet by mouth daily.    [provider]  furosemide (LASIX) 20 MG tablet Take 1 tablet (20 mg total) by mouth daily. Patient taking differently: Take 40 mg by mouth daily.  03/20/16   Tanna Furry, MD  HYDROcodone-acetaminophen (NORCO/VICODIN) 5-325 MG tablet Take 1 tablet by mouth 4 (four) times daily as needed for moderate pain.    [provider]  lisinopril-hydrochlorothiazide (PRINZIDE,ZESTORETIC) 10-12.5 MG tablet Take 1 tablet by mouth daily.    [provider]  Multiple Vitamin (MULITIVITAMIN WITH MINERALS) TABS Take 1 tablet by mouth daily.    [provider]  omeprazole (PRILOSEC) 20 MG capsule Take 20 mg by mouth daily.    [provider]  penicillin v potassium (VEETID) 500 MG tablet Take 1 tablet (500 mg total) by mouth 4 (four) times daily for 7 days. 07/19/17 07/26/17  Ward, Ozella Almond, PA-C  pregabalin (LYRICA) 75 MG capsule Take 75 mg by mouth 3 (three) times daily as needed.     [provider]    Family History Family History  Problem  Relation Age of Onset  . Hyperlipidemia Mother   . Hypertension Mother   . Other Mother        varicose veins  . Diabetes Father   . Hyperlipidemia Father   . Hypertension Father   . Peripheral vascular disease Father   . Diabetes Sister   . Hyperlipidemia Sister   . Hypertension Sister   . Other Sister        varicose veins  . Hyperlipidemia Brother   . Hypertension Brother   . Peripheral vascular disease Brother   . Heart disease Daughter        before age 23    Social History Social History   Tobacco Use  . Smoking status: Former Smoker    Types: Cigarettes    Last attempt to quit: 03/31/1969    Years since quitting: 48.3   . Smokeless tobacco: Current User  Substance Use Topics  . Alcohol use: Yes  . Drug use: No     Allergies   Clonazepam and Tramadol   Review of Systems Review of Systems  Constitutional: Negative for chills and fever.  HENT: Positive for dental problem. Negative for facial swelling and sore throat.   Respiratory: Negative for cough.   Musculoskeletal: Negative for neck pain.     Physical Exam Updated Vital Signs BP (!) 161/75 (BP Location: Right Arm)   Pulse 66   Temp 97.9 F (36.6 C) (Oral)   Resp 18   Ht 5\' 1"  (1.549 m)   Wt 73.5 kg (162 lb)   SpO2 96%   BMI 30.61 kg/m   Physical Exam  Constitutional: She appears well-developed and well-nourished. No distress.  HENT:  Head: Normocephalic and atraumatic.  Mouth/Throat:    Dental cavities and poor oral dentition noted. Pain along tooth as depicted in image. No abscess noted. Midline uvula. No trismus. OP moist and clear. No oropharyngeal erythema or edema. Neck supple with no tenderness. No facial edema.  Neck: Neck supple.  Cardiovascular: Normal rate, regular rhythm and normal heart sounds.  No murmur heard. Pulmonary/Chest: Effort normal and breath sounds normal. No respiratory distress. She has no wheezes. She has no rales.  Musculoskeletal: Normal range of motion.  Neurological: She is alert.  Skin: Skin is warm and dry.  Nursing note and vitals reviewed.    ED Treatments / Results  Labs (all labs ordered are listed, but only abnormal results are displayed) Labs Reviewed - No data to display  EKG None  Radiology No results found.  Procedures Procedures (including critical care time)  Medications Ordered in ED Medications - No data to display   Initial Impression / Assessment and Plan / ED Course  I have reviewed the triage vital signs and the nursing notes.  Pertinent labs & imaging results that were available during my care of the patient were reviewed by me and considered in my medical  decision making (see chart for details).    Haley Long is a 67 y.o. female who presents to ED for dental pain. No abscess requiring immediate incision and drainage. Patient is afebrile, non toxic appearing, and swallowing secretions well. Exam not concerning for Ludwig's angina or pharyngeal abscess. Will treat with PenVK.  She missed her last appointment with a dentist 2 months ago and has not rescheduled.  She was supposed to get the tooth extracted at that time.  I stressed the importance of following up with the dentist for ultimate management of her dental pain. Patient voices understanding  and is agreeable to plan.  Patient discussed with Dr. Thomasene Lot who agrees with treatment plan.    Final Clinical Impressions(s) / ED Diagnoses   Final diagnoses:  Pain, dental    ED Discharge Orders        Ordered    penicillin v potassium (VEETID) 500 MG tablet  4 times daily     07/19/17 1545       Ward, Ozella Almond, PA-C 07/19/17 1551    Macarthur Critchley, MD 07/22/17 0845

## 2017-07-19 NOTE — Discharge Instructions (Signed)
It is very important that you get evaluated by a dentist as soon as possible. Call tomorrow to schedule an appointment. Tylenol as needed for pain. Take your full course of antibiotics. Read the instructions below.  Eat a soft or liquid diet and rinse your mouth out after meals with warm water. You should see a dentist or return here at once if you have increased swelling, increased pain or uncontrolled bleeding from the site of your injury.  SEEK MEDICAL CARE IF:  You have swelling around your tooth, in your face or neck.  You have bleeding which starts, continues, or gets worse.  You have a fever >101 If you are unable to open your mouth New or worsening symptoms develop, any additional concerns.

## 2017-07-19 NOTE — ED Notes (Signed)
Declined W/C at D/C and was escorted to lobby by RN. 

## 2017-07-19 NOTE — ED Triage Notes (Signed)
PT reports dental pain . Pt missed  Dental appt. In Feb.

## 2017-12-20 IMAGING — DX DG CHEST 2V
2 series · 2 of 2 positions shown · non-contrast
Comparison: 03/29/2015

CLINICAL DATA: Lower extremity swelling for 2 days.

EXAM:
CHEST  2 VIEW

[chest pa]
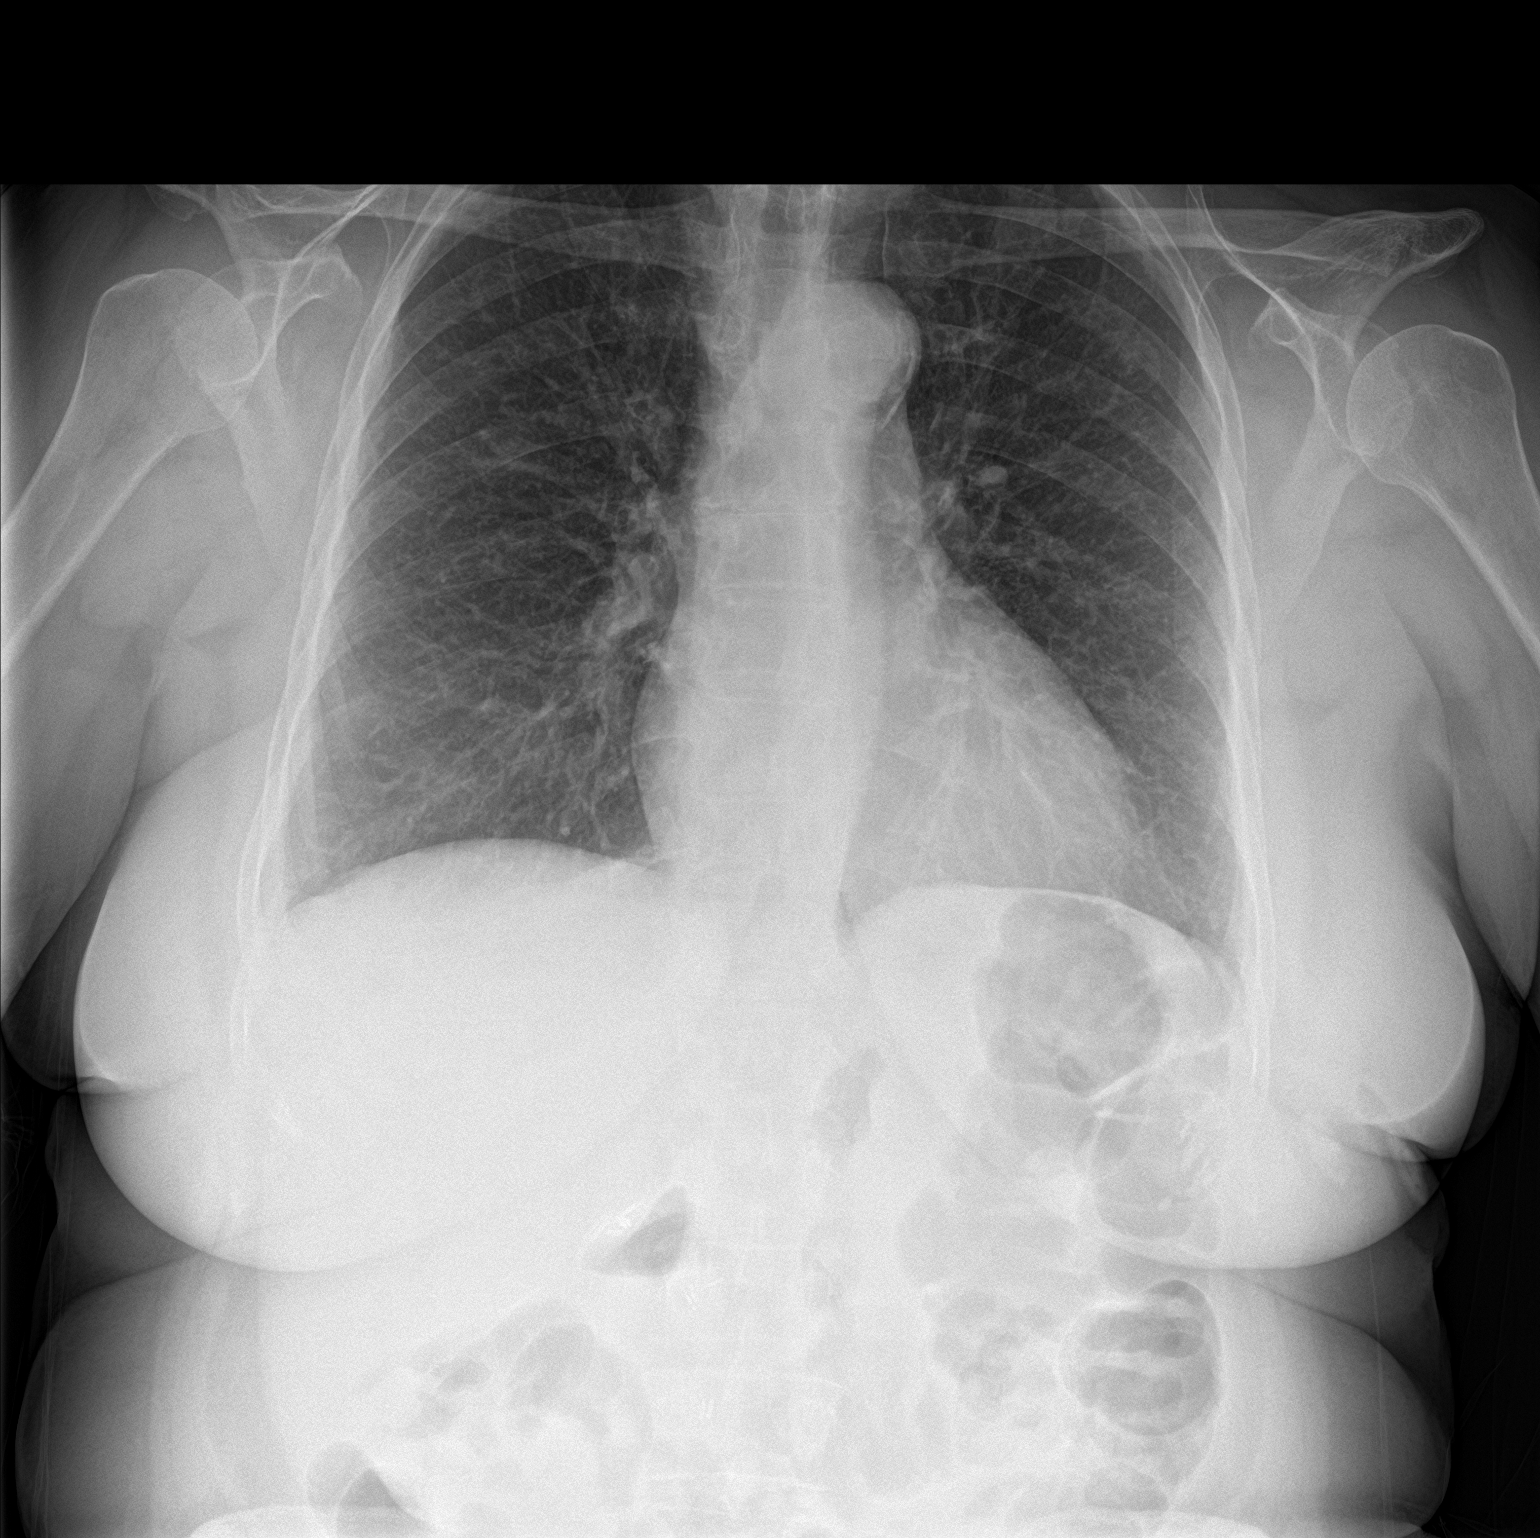

[chest lat]
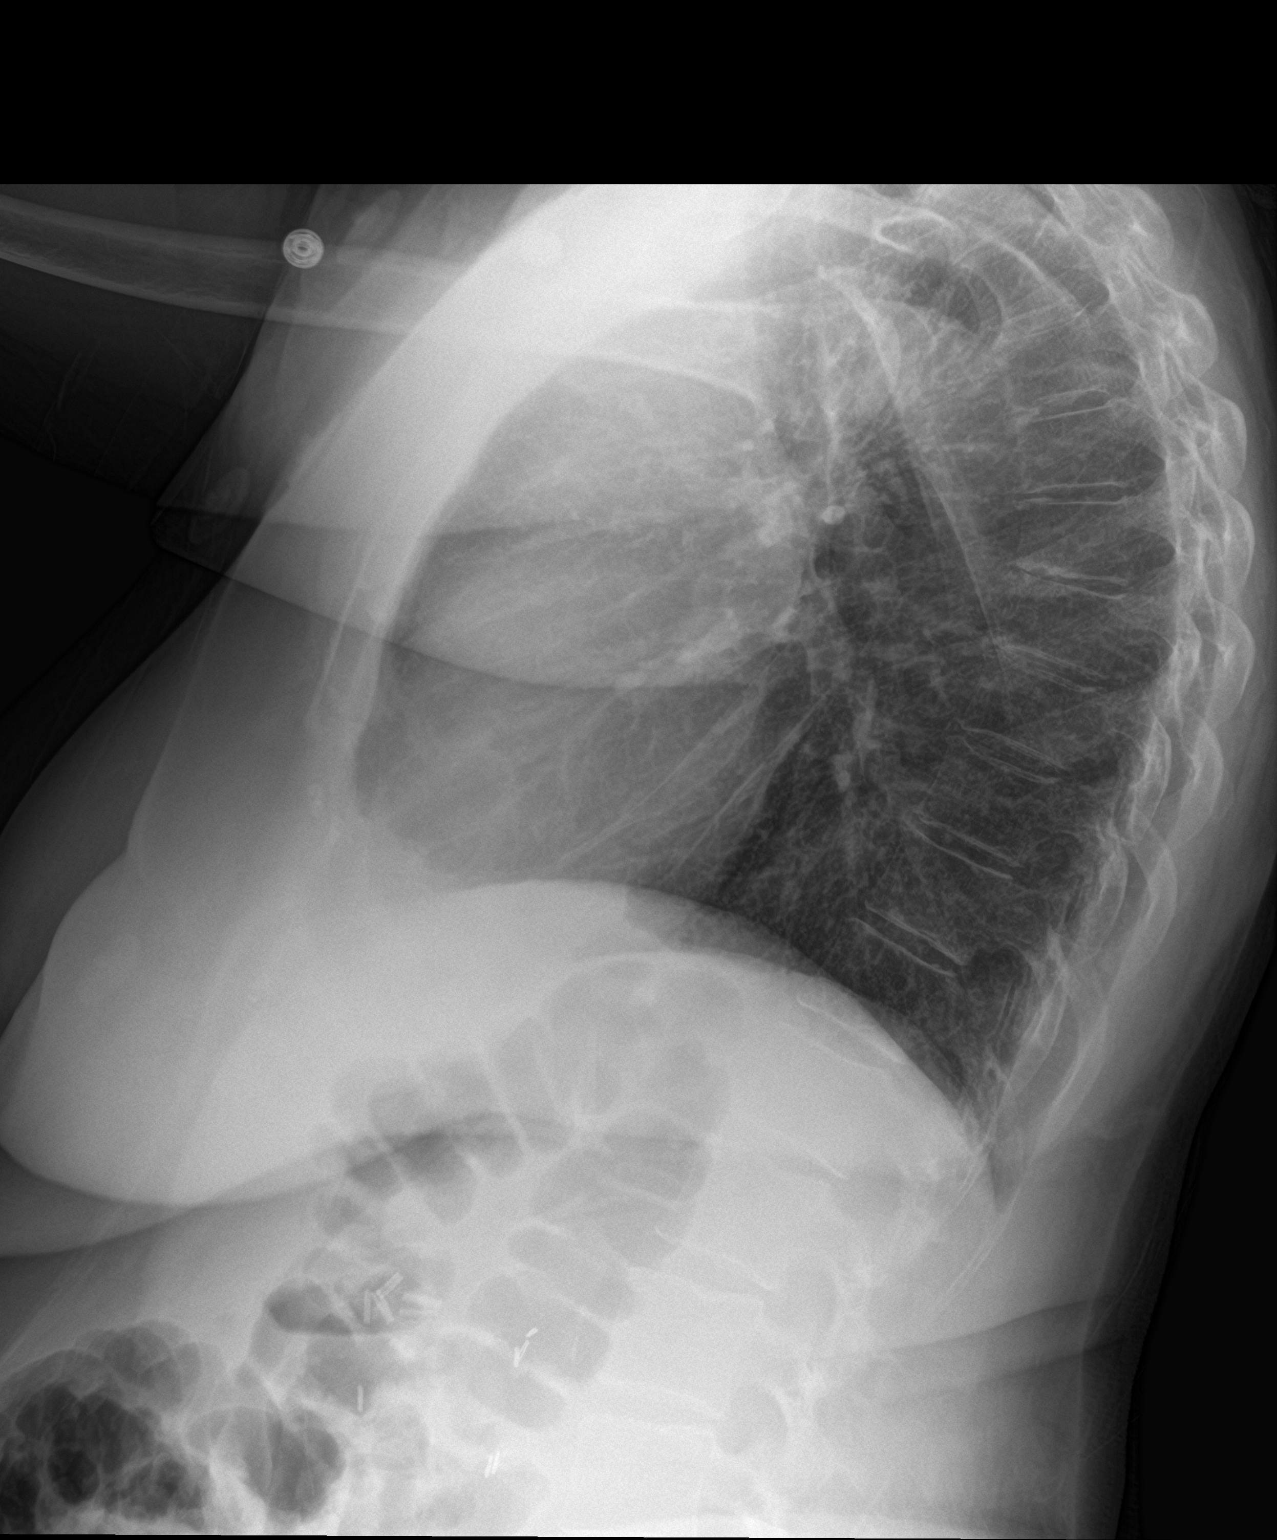

[2 of 2 positions shown; findings below may reference images not displayed]

FINDINGS: The heart size and mediastinal contours are within normal limits.
Both lungs are clear. The visualized skeletal structures are
unremarkable.
IMPRESSION: No active cardiopulmonary disease.

## 2018-01-13 NOTE — H&P (Signed)
HISTORY AND PHYSICAL  Haley Long is a 67 y.o. female patient with CC: painful teeth. Referred by general dentist for full mouth extractions.   No diagnosis found.  Past Medical History:  Diagnosis Date  . Arthritis   . Cancer ([place])    uterine  . Coronary artery disease   . Hypertension   . Irregular heart beat   . Varicose veins     No current facility-administered medications for this encounter.    Current Outpatient Medications  Medication Sig Dispense Refill  . aspirin EC 81 MG tablet Take 81 mg by mouth daily.    . cholecalciferol (VITAMIN D) 1000 units tablet Take 1,000 Units by mouth daily.    . ferrous sulfate 325 (65 FE) MG tablet Take 1 tablet by mouth daily.    Marland Kitchen HYDROcodone-acetaminophen (NORCO/VICODIN) 5-325 MG tablet Take 1 tablet by mouth 4 (four) times daily as needed for moderate pain.    Marland Kitchen lisinopril-hydrochlorothiazide (PRINZIDE,ZESTORETIC) 10-12.5 MG tablet Take 1 tablet by mouth daily.    . Multiple Vitamin (MULITIVITAMIN WITH MINERALS) TABS Take 1 tablet by mouth daily.    Marland Kitchen omeprazole (PRILOSEC) 20 MG capsule Take 20 mg by mouth daily.    . pregabalin (LYRICA) 75 MG capsule Take 75 mg by mouth 3 (three) times daily as needed.     . cephALEXin (KEFLEX) 500 MG capsule Take 1 capsule (500 mg total) by mouth 3 (three) times daily. (Patient not taking: Reported on 05/12/2017) 30 capsule 0  . diphenhydrAMINE (BENADRYL) 25 MG tablet Take 1 tablet (25 mg total) by mouth every 6 (six) hours. (Patient not taking: Reported on 01/11/2018) 12 tablet 0  . famotidine (PEPCID) 20 MG tablet Take 1 tablet (20 mg total) by mouth 2 (two) times daily. (Patient not taking: Reported on 01/11/2018) 10 tablet 0  . furosemide (LASIX) 20 MG tablet Take 1 tablet (20 mg total) by mouth daily. (Patient not taking: Reported on 01/11/2018) 10 tablet 0   Allergies  Allergen Reactions  . Clonazepam Nausea And Vomiting and Other (See Comments)    Patient claims its just the odt  .  Tramadol Anxiety   Active Problems:   * No active hospital problems. *  Vitals: There were no vitals taken for this visit. Lab results:No results found for this or any previous visit (from the past 105 hour(s)). Radiology Results: No results found. General appearance: alert, cooperative, no distress and moderately obese Head: Normocephalic, without obvious abnormality, atraumatic Eyes: negative Nose: Nares normal. Septum midline. Mucosa normal. No drainage or sinus tenderness. Throat: multiple carious teeth. multiple teeth with severe erosion, attrition and abrasion. Bilateral mandibular lingual tori. No edema, purulence, fluctuance, or trismus.  Neck: no adenopathy, supple, symmetrical, trachea midline and thyroid not enlarged, symmetric, no tenderness/mass/nodules Resp: clear to auscultation bilaterally Cardio: regular rate and rhythm, S1, S2 normal, no murmur, click, rub or gallop Extremities: edema bilateral LE 2+ edema  Assessment:  All teeth nonrestorable. Bilateral lingual tori  Plan: Full mouth dental extractions. Removal lingual tori. Alveoloplasty. GA. Day surgery.    Diona Browner 01/13/2018

## 2018-01-14 ENCOUNTER — Other Ambulatory Visit: Payer: Self-pay

## 2018-01-14 ENCOUNTER — Encounter (HOSPITAL_COMMUNITY): Payer: Self-pay | Admitting: *Deleted

## 2018-01-15 ENCOUNTER — Ambulatory Visit (HOSPITAL_COMMUNITY)
Admission: RE | Admit: 2018-01-15 | Discharge: 2018-01-15 | Disposition: A | Discharge status: Home / Self Care | Payer: Medicare Other | Source: Ambulatory Visit | Attending: Oral Surgery | Admitting: Oral Surgery

## 2018-01-15 ENCOUNTER — Encounter (HOSPITAL_COMMUNITY): Payer: Self-pay | Admitting: General Practice

## 2018-01-15 ENCOUNTER — Ambulatory Visit (HOSPITAL_COMMUNITY): Payer: Medicare Other | Admitting: Certified Registered Nurse Anesthetist

## 2018-01-15 ENCOUNTER — Encounter (HOSPITAL_COMMUNITY)
Admission: RE | Disposition: A | Discharge status: Home / Self Care | Payer: Self-pay | Source: Ambulatory Visit | Attending: Oral Surgery

## 2018-01-15 DIAGNOSIS — Z79899 Other long term (current) drug therapy: Secondary | ICD-10-CM | POA: Insufficient documentation

## 2018-01-15 DIAGNOSIS — K029 Dental caries, unspecified: Secondary | ICD-10-CM | POA: Diagnosis present

## 2018-01-15 DIAGNOSIS — Z87891 Personal history of nicotine dependence: Secondary | ICD-10-CM | POA: Insufficient documentation

## 2018-01-15 DIAGNOSIS — I2510 Atherosclerotic heart disease of native coronary artery without angina pectoris: Secondary | ICD-10-CM | POA: Insufficient documentation

## 2018-01-15 DIAGNOSIS — I10 Essential (primary) hypertension: Secondary | ICD-10-CM | POA: Insufficient documentation

## 2018-01-15 DIAGNOSIS — K011 Impacted teeth: Secondary | ICD-10-CM | POA: Diagnosis not present

## 2018-01-15 DIAGNOSIS — Z7982 Long term (current) use of aspirin: Secondary | ICD-10-CM | POA: Insufficient documentation

## 2018-01-15 DIAGNOSIS — K056 Periodontal disease, unspecified: Secondary | ICD-10-CM | POA: Diagnosis not present

## 2018-01-15 DIAGNOSIS — K219 Gastro-esophageal reflux disease without esophagitis: Secondary | ICD-10-CM | POA: Diagnosis not present

## 2018-01-15 HISTORY — DX: Peripheral vascular disease, unspecified: I73.9

## 2018-01-15 HISTORY — DX: Cardiac murmur, unspecified: R01.1

## 2018-01-15 HISTORY — PX: TOOTH EXTRACTION: SHX859

## 2018-01-15 LAB — BASIC METABOLIC PANEL
Anion gap: 10 (ref 5–15)
BUN: 11 mg/dL (ref 8–23)
CHLORIDE: 101 mmol/L (ref 98–111)
CO2: 25 mmol/L (ref 22–32)
CREATININE: 0.7 mg/dL (ref 0.44–1.00)
Calcium: 9.6 mg/dL (ref 8.9–10.3)
Glucose, Bld: 89 mg/dL (ref 70–99)
POTASSIUM: 3.7 mmol/L (ref 3.5–5.1)
Sodium: 136 mmol/L (ref 135–145)

## 2018-01-15 LAB — CBC
HCT: 32.9 % — ABNORMAL LOW (ref 36.0–46.0)
HEMOGLOBIN: 10.4 g/dL — AB (ref 12.0–15.0)
MCH: 28.7 pg (ref 26.0–34.0)
MCHC: 31.6 g/dL (ref 30.0–36.0)
MCV: 90.9 fL (ref 80.0–100.0)
Platelets: 266 10*3/uL (ref 150–400)
RBC: 3.62 MIL/uL — AB (ref 3.87–5.11)
RDW: 12.3 % (ref 11.5–15.5)
WBC: 4.6 10*3/uL (ref 4.0–10.5)
nRBC: 0 % (ref 0.0–0.2)

## 2018-01-15 SURGERY — DENTAL RESTORATION/EXTRACTIONS
Anesthesia: General | Site: Mouth

## 2018-01-15 MED ORDER — AMOXICILLIN 500 MG PO CAPS: 500.0000 mg | ORAL_CAPSULE | Freq: Three times a day (TID) | ORAL | 0 refills | Status: DC

## 2018-01-15 MED ORDER — OXYCODONE HCL 5 MG/5ML PO SOLN: 5.0000 mg | Freq: Once | ORAL | Status: DC | PRN

## 2018-01-15 MED ORDER — FENTANYL CITRATE (PF) 100 MCG/2ML IJ SOLN
INTRAMUSCULAR | Status: DC | PRN
  Administered 2018-01-15 (×2): 25 ug via INTRAVENOUS

## 2018-01-15 MED ORDER — PHENYLEPHRINE 40 MCG/ML (10ML) SYRINGE FOR IV PUSH (FOR BLOOD PRESSURE SUPPORT)
PREFILLED_SYRINGE | INTRAVENOUS | Status: DC | PRN
  Administered 2018-01-15 (×3): 80 ug via INTRAVENOUS

## 2018-01-15 MED ORDER — SODIUM CHLORIDE 0.9 % IV SOLN
INTRAVENOUS | Status: DC | PRN
  Administered 2018-01-15: 50 ug/min via INTRAVENOUS

## 2018-01-15 MED ORDER — PROPOFOL 10 MG/ML IV BOLUS
INTRAVENOUS | Status: DC | PRN
  Administered 2018-01-15: 60 mg via INTRAVENOUS
  Administered 2018-01-15: 140 mg via INTRAVENOUS

## 2018-01-15 MED ORDER — LIDOCAINE-EPINEPHRINE 2 %-1:100000 IJ SOLN
INTRAMUSCULAR | Status: DC | PRN
  Administered 2018-01-15: 18 mL

## 2018-01-15 MED ORDER — LACTATED RINGERS IV SOLN
INTRAVENOUS | Status: DC | PRN
  Administered 2018-01-15: 07:00:00 via INTRAVENOUS

## 2018-01-15 MED ORDER — DEXAMETHASONE SODIUM PHOSPHATE 4 MG/ML IJ SOLN
INTRAMUSCULAR | Status: DC | PRN
  Administered 2018-01-15: 10 mg via INTRAVENOUS

## 2018-01-15 MED ORDER — FENTANYL CITRATE (PF) 250 MCG/5ML IJ SOLN
INTRAMUSCULAR | Status: AC
  Filled 2018-01-15: qty 5

## 2018-01-15 MED ORDER — 0.9 % SODIUM CHLORIDE (POUR BTL) OPTIME
TOPICAL | Status: DC | PRN
  Administered 2018-01-15: 1000 mL

## 2018-01-15 MED ORDER — CEFAZOLIN SODIUM-DEXTROSE 2-4 GM/100ML-% IV SOLN
2.0000 g | INTRAVENOUS | Status: AC
Start: 2018-01-15 — End: 2018-01-15
  Administered 2018-01-15: 2 g via INTRAVENOUS

## 2018-01-15 MED ORDER — SODIUM CHLORIDE 0.9 % IR SOLN
Status: DC | PRN
  Administered 2018-01-15: 1000 mL

## 2018-01-15 MED ORDER — LIDOCAINE 2% (20 MG/ML) 5 ML SYRINGE
INTRAMUSCULAR | Status: AC
  Filled 2018-01-15: qty 5

## 2018-01-15 MED ORDER — FENTANYL CITRATE (PF) 100 MCG/2ML IJ SOLN: 25.0000 ug | INTRAMUSCULAR | Status: DC | PRN

## 2018-01-15 MED ORDER — DEXAMETHASONE SODIUM PHOSPHATE 10 MG/ML IJ SOLN
INTRAMUSCULAR | Status: AC
  Filled 2018-01-15: qty 1

## 2018-01-15 MED ORDER — SUCCINYLCHOLINE CHLORIDE 20 MG/ML IJ SOLN
INTRAMUSCULAR | Status: DC | PRN
  Administered 2018-01-15: 100 mg via INTRAVENOUS

## 2018-01-15 MED ORDER — LIDOCAINE-EPINEPHRINE 2 %-1:100000 IJ SOLN
INTRAMUSCULAR | Status: AC
  Filled 2018-01-15: qty 1

## 2018-01-15 MED ORDER — OXYCODONE HCL 5 MG PO TABS: 5.0000 mg | ORAL_TABLET | Freq: Once | ORAL | Status: DC | PRN

## 2018-01-15 MED ORDER — LIDOCAINE 2% (20 MG/ML) 5 ML SYRINGE
INTRAMUSCULAR | Status: DC | PRN
  Administered 2018-01-15: 60 mg via INTRAVENOUS

## 2018-01-15 MED ORDER — OXYCODONE-ACETAMINOPHEN 5-325 MG PO TABS: 1.0000 | ORAL_TABLET | ORAL | 0 refills | Status: DC | PRN

## 2018-01-15 MED ORDER — ONDANSETRON HCL 4 MG/2ML IJ SOLN: 4.0000 mg | Freq: Once | INTRAMUSCULAR | Status: DC | PRN

## 2018-01-15 MED ORDER — OXYMETAZOLINE HCL 0.05 % NA SOLN
NASAL | Status: AC
  Filled 2018-01-15: qty 15

## 2018-01-15 MED ORDER — ONDANSETRON HCL 4 MG/2ML IJ SOLN
INTRAMUSCULAR | Status: DC | PRN
  Administered 2018-01-15: 4 mg via INTRAVENOUS

## 2018-01-15 MED ORDER — OXYCODONE HCL 5 MG PO TABS
ORAL_TABLET | ORAL | Status: AC
  Filled 2018-01-15: qty 1

## 2018-01-15 MED ORDER — CEFAZOLIN SODIUM-DEXTROSE 2-4 GM/100ML-% IV SOLN
INTRAVENOUS | Status: AC
  Filled 2018-01-15: qty 100

## 2018-01-15 MED ORDER — ONDANSETRON HCL 4 MG/2ML IJ SOLN
INTRAMUSCULAR | Status: AC
  Filled 2018-01-15: qty 2

## 2018-01-15 SURGICAL SUPPLY — 39 items
BLADE SURG 15 STRL LF DISP TIS (BLADE) ×1 IMPLANT
BLADE SURG 15 STRL SS (BLADE) ×3
BUR CROSS CUT FISSURE 1.6 (BURR) ×2 IMPLANT
BUR CROSS CUT FISSURE 1.6MM (BURR) ×1
BUR EGG ELITE 4.0 (BURR) ×2 IMPLANT
BUR EGG ELITE 4.0MM (BURR) ×1
CANISTER SUCT 3000ML PPV (MISCELLANEOUS) ×3 IMPLANT
COVER SURGICAL LIGHT HANDLE (MISCELLANEOUS) ×3 IMPLANT
COVER WAND RF STERILE (DRAPES) ×3 IMPLANT
DECANTER SPIKE VIAL GLASS SM (MISCELLANEOUS) ×3 IMPLANT
DRAPE U-SHAPE 76X120 STRL (DRAPES) ×3 IMPLANT
GAUZE PACKING FOLDED 2  STR (GAUZE/BANDAGES/DRESSINGS) ×2
GAUZE PACKING FOLDED 2 STR (GAUZE/BANDAGES/DRESSINGS) ×1 IMPLANT
GLOVE BIO SURGEON STRL SZ 6.5 (GLOVE) ×1 IMPLANT
GLOVE BIO SURGEON STRL SZ7 (GLOVE) IMPLANT
GLOVE BIO SURGEON STRL SZ7.5 (GLOVE) ×3 IMPLANT
GLOVE BIO SURGEONS STRL SZ 6.5 (GLOVE) ×1
GLOVE BIOGEL PI IND STRL 6.5 (GLOVE) IMPLANT
GLOVE BIOGEL PI IND STRL 7.0 (GLOVE) IMPLANT
GLOVE BIOGEL PI INDICATOR 6.5 (GLOVE) ×2
GLOVE BIOGEL PI INDICATOR 7.0 (GLOVE)
GOWN STRL REUS W/ TWL LRG LVL3 (GOWN DISPOSABLE) ×1 IMPLANT
GOWN STRL REUS W/ TWL XL LVL3 (GOWN DISPOSABLE) ×1 IMPLANT
GOWN STRL REUS W/TWL LRG LVL3 (GOWN DISPOSABLE) ×3
GOWN STRL REUS W/TWL XL LVL3 (GOWN DISPOSABLE) ×3
IV NS 1000ML (IV SOLUTION) ×3
IV NS 1000ML BAXH (IV SOLUTION) ×1 IMPLANT
KIT BASIN OR (CUSTOM PROCEDURE TRAY) ×3 IMPLANT
KIT TURNOVER KIT B (KITS) ×3 IMPLANT
NEEDLE 22X1 1/2 (OR ONLY) (NEEDLE) ×6 IMPLANT
NS IRRIG 1000ML POUR BTL (IV SOLUTION) ×3 IMPLANT
PAD ARMBOARD 7.5X6 YLW CONV (MISCELLANEOUS) ×7 IMPLANT
SUT CHROMIC 3 0 PS 2 (SUTURE) ×5 IMPLANT
SYR CONTROL 10ML LL (SYRINGE) ×3 IMPLANT
TRAY ENT MC OR (CUSTOM PROCEDURE TRAY) ×3 IMPLANT
TUBE CONNECTING 12'X1/4 (SUCTIONS) ×1
TUBE CONNECTING 12X1/4 (SUCTIONS) ×1 IMPLANT
TUBING IRRIGATION (MISCELLANEOUS) ×3 IMPLANT
YANKAUER SUCT BULB TIP NO VENT (SUCTIONS) ×5 IMPLANT

## 2018-01-15 NOTE — Op Note (Signed)
01/15/2018  8:46 AM  PATIENT:  Haley Long  67 y.o. female  PRE-OPERATIVE DIAGNOSIS:  NON-RESTORABLE TEETH # 1, 2, 3, 4, 5, 6, 7, 8, 9, 10, 11, 12, 13, 14, 15, 18, 20, 21, 22, 23, 24, 25, 26, 27, 28, 29, 31, IMPACTED TOOTH # 16; BILATERAL MANDIBULAR LINGUAL TORI  POST-OPERATIVE DIAGNOSIS:  SAME  PROCEDURE:  Procedure(s): EXTRACTIONS  TEETH # 1, 2, 3, 4, 5, 6, 7, 8, 9, 10, 11, 12, 13, 14, 15, 16, 18, 20, 21, 22, 23, 24, 25, 26, 27, 28, 29, 31, ALVEOLOPLASTY, REMOVAL MANDIBULAR LINGUAL TORI  SURGEON:  Surgeon(s): Diona Browner, DDS  ANESTHESIA:   local and general  EBL:  minimal  DRAINS: none   SPECIMEN:  No Specimen  COUNTS:  YES  PLAN OF CARE: Discharge to home after PACU  PATIENT DISPOSITION:  PACU - hemodynamically stable.   PROCEDURE DETAILS: Dictation # [id number]  Gae Bon, DMD 01/15/2018 8:46 AM

## 2018-01-15 NOTE — Anesthesia Postprocedure Evaluation (Signed)
Anesthesia Post Note  Patient: Haley Long  Procedure(s) Performed: DENTAL RESTORATION/EXTRACTIONS (N/A Mouth)     Patient location during evaluation: PACU Anesthesia Type: General Level of consciousness: awake and alert Pain management: pain level controlled Vital Signs Assessment: post-procedure vital signs reviewed and stable Respiratory status: spontaneous breathing, nonlabored ventilation and respiratory function stable Cardiovascular status: blood pressure returned to baseline and stable Postop Assessment: no apparent nausea or vomiting Anesthetic complications: no    Last Vitals:  Vitals:   01/15/18 0949 01/15/18 1001  BP: (!) 154/60 (!) 141/78  Pulse: 74 72  Resp:    Temp:    SpO2: 97% 98%    Last Pain:  Vitals:   01/15/18 0925  TempSrc:   PainSc: 3                  Lidia Collum

## 2018-01-15 NOTE — Anesthesia Preprocedure Evaluation (Addendum)
Anesthesia Evaluation  Patient identified by MRN, date of birth, ID band  Reviewed: Allergy & Precautions, NPO status , Patient's Chart, lab work & pertinent test results  History of Anesthesia Complications Negative for: history of anesthetic complications  Airway Mallampati: II  TM Distance: >3 FB Neck ROM: Full    Dental  (+) Poor Dentition Very poor dentition with multiple broken teeth. No loose teeth.:   Pulmonary neg pulmonary ROS, former smoker,    Pulmonary exam normal        Cardiovascular hypertension, Pt. on medications + CAD and + Peripheral Vascular Disease  Normal cardiovascular exam+ dysrhythmias ("irregular heart beat" but does not think it was A fib- not on any meds or AC)      Neuro/Psych negative neurological ROS  negative psych ROS   GI/Hepatic Neg liver ROS, GERD  Medicated,  Endo/Other  States she was once told she is "borderline" diabetic. Not on any meds.  Renal/GU negative Renal ROS  negative genitourinary   Musculoskeletal negative musculoskeletal ROS (+)   Abdominal   Peds  Hematology negative hematology ROS (+)   Anesthesia Other Findings   Reproductive/Obstetrics                           Anesthesia Physical Anesthesia Plan  ASA: III  Anesthesia Plan: General   Post-op Pain Management:    Induction: Intravenous  PONV Risk Score and Plan: 3 and Ondansetron, Dexamethasone and Treatment may vary due to age or medical condition  Airway Management Planned: Nasal ETT  Additional Equipment: None  Intra-op Plan:   Post-operative Plan: Extubation in OR  Informed Consent: I have reviewed the patients History and Physical, chart, labs and discussed the procedure including the risks, benefits and alternatives for the proposed anesthesia with the patient or authorized representative who has indicated his/her understanding and acceptance.     Plan Discussed  with:   Anesthesia Plan Comments:        Anesthesia Quick Evaluation

## 2018-01-15 NOTE — H&P (Signed)
H&P documentation  -History and Physical Reviewed  -Patient has been re-examined  -No change in the plan of care  Haley Long  

## 2018-01-15 NOTE — Transfer of Care (Signed)
Immediate Anesthesia Transfer of Care Note  Patient: Haley Long  Procedure(s) Performed: DENTAL RESTORATION/EXTRACTIONS (N/A Mouth)  Patient Location: PACU  Anesthesia Type:General  Level of Consciousness: awake  Airway & Oxygen Therapy: Patient Spontanous Breathing and Patient connected to face mask oxygen  Post-op Assessment: Report given to RN and Post -op Vital signs reviewed and stable  Post vital signs: Reviewed and stable  Last Vitals:  Vitals Value Taken Time  BP 144/67 01/15/2018  8:52 AM  Temp    Pulse 86 01/15/2018  8:53 AM  Resp 18 01/15/2018  8:53 AM  SpO2 100 % 01/15/2018  8:53 AM  Vitals shown include unvalidated device data.  Last Pain:  Vitals:   01/15/18 0649  TempSrc: Oral  PainSc: 0-No pain      Patients Stated Pain Goal: 2 (92/34/14 4360)  Complications: No apparent anesthesia complications

## 2018-01-15 NOTE — Anesthesia Procedure Notes (Signed)
Procedure Name: Intubation Date/Time: 01/15/2018 7:47 AM Performed by: Lieutenant Diego, CRNA Pre-anesthesia Checklist: Patient identified, Emergency Drugs available, Suction available and Patient being monitored Patient Re-evaluated:Patient Re-evaluated prior to induction Oxygen Delivery Method: Circle system utilized Preoxygenation: Pre-oxygenation with 100% oxygen Induction Type: IV induction Ventilation: Mask ventilation without difficulty Laryngoscope Size: Stender, 2 and Glidescope Grade View: Grade I Nasal Tubes: Nasal prep performed, Nasal Rae, Left and Magill forceps- large, utilized Tube size: 7.0 mm Number of attempts: 2 Placement Confirmation: ETT inserted through vocal cords under direct vision,  positive ETCO2 and breath sounds checked- equal and bilateral Secured at: 26 cm Tube secured with: Tape Dental Injury: Teeth and Oropharynx as per pre-operative assessment  Comments: attempt with Ramey 2, grade 1 view, esop intubation recognized immediately, used glide scope and left nare, easy intubation thru cords

## 2018-01-15 NOTE — Op Note (Signed)
NAME: Haley Long, Haley Long MEDICAL RECORD [id number] ACCOUNT [id number] DATE OF BIRTH:08-05-1950 FACILITY: [facility] LOCATION: [facility] PHYSICIAN:Mailen Newborn M. Alura Olveda, DDS  OPERATIVE REPORT  DATE OF PROCEDURE:  01/15/2018  PREOPERATIVE DIAGNOSIS:  Nonrestorable teeth secondary to dental caries and periodontal disease, numbers 1, 2, 3, 4, 5, 6, 7, 8, 9, 10, 11, 12, 13, 14, 15, 18, 20, 21, 22, 23, 24, 25, 26, 27, 28, 29, 31.  Impacted tooth #16, bilateral mandibular lingual  tori.  POSTOPERATIVE DIAGNOSIS:  Nonrestorable teeth secondary to dental caries and periodontal disease, numbers 1, 2, 3, 4, 5, 6, 7, 8, 9, 10, 11, 12, 13, 14, 15, 18, 20, 21, 22, 23, 24, 25, 26, 27, 28, 29, 31.  Impacted tooth #16, bilateral mandibular lingual  tori.  PROCEDURE:  Extraction of teeth numbers 1, 2, 3, 4, 5, 6 7, 8, 9, 10, 11, 12, 13, 14, 15, 16, 18, 20, 21, 22, 23, 24, 25, 26, 27, 28, 29, 31,  alveoplasty right and left maxilla, removal of bilateral mandibular lingual tori.  SURGEON:  Diona Browner, DDS  ANESTHESIA:  General, Dr. Ola Spurr attending, nasal intubation.  PROCEDURE IN DETAIL:  The patient was taken to the operating room and placed on the table in supine position.  General anesthesia was administered intravenously, and a nasal endotracheal tube was placed and secured.  The eyes were protected.  The patient  was draped for surgery.  A timeout was performed.  The posterior pharynx was suctioned and a throat pack was placed.  Lidocaine 2% 1:100,000 epinephrine was infiltrated in an inferior alveolar block in the right and left mandible and in buccal and  palatal infiltration in the maxilla and in the anterior buccal infiltration in the mandible.  A total of 18 mL was utilized.  A bite block was placed on the right side of the mouth, and a sweetheart retractor was used to retract the tongue.  A 15 blade  was used to make an incision beginning at tooth #18, carried forward across the midline in both the  buccal and lingual sulcus to tooth #27.  The periosteum was reflected.  The teeth were elevated with a 301 elevator and removed from the mouth with dental  forceps.  Then the sockets were curetted.  The periosteum was reflected to expose the alveolar crest, which was irregular in contour owing to the inherent contour of the sockets, and the lingual torus was also exposed at this time.  Then, a Stryker  handpiece was used with an egg bur to perform alveoplasty and to remove the lingual torus, taking care to retract the lingual tissues with a Estate manager/land agent.  Then, this area was smoothed down with a bone file, and then the sockets were irrigated and  everything was closed in this quadrant with 3-0 chromic.  Then, in the left maxilla, a 15 blade was used to make an incision overlying impacted tooth #16, carrying forward in the buccal and palatal sulcus until tooth #6 was encountered.  Then, the  periosteum was reflected.  The teeth were elevated and removed from the mouth with the dental forceps.  Tooth #14 fractured during removal, and additional bone was removed around the distal buccal root and the root was removed with the root tip pick and  hemostats.  Tooth #16 required removal of bone circumferentially around the tooth and overlying the tooth, and then the tooth was removed with a 301 elevator.  Then, the sockets were curetted.  The periosteum was reflected to expose the alveolar  crest,  and then alveoplasty was performed using egg-shaped bur and bone file.  Then, the area was irrigated and closed with 3-0 chromic.  The bite block and sweetheart retractor were repositioned to the other side of the mouth, and a 15 blade was used to make  an incision beginning at tooth #27, carrying proximally in his buccal and lingual sulcus until tooth #31 was reached,  Then in the maxilla, a 15 blade was used to make an incision around teeth numbers 1 through 6 buccally and palatally.  Then the  periosteum was  reflected from around these teeth.  The teeth were elevated with a 301 elevator.  The mandibular teeth were removed with the dental forceps.  The maxillary teeth were removed as well.  Tooth #3 fractured during removal, and then additional  bone was removed using a Stryker handpiece with a fissure bur, and a root tip was removed with a root tip pick.  Then, the periosteum was reflected to expose the alveolar crest in both the maxilla and mandible and the lingual torus in the right  mandible.  The alveoplasty was performed using an egg-shaped bur and bone file, and the lingual torus was removed using the egg-shaped bur under irrigation.  Then, these areas were irrigated and closed with 3-0 chromic.  The oral cavity was then  inspected and found to have good contour, hemostasis and closure.  The oral cavity was irrigated and suctioned and throat pack was removed.  The patient was left in the care of anesthesia for extubation and transport to recovery room with plans for  discharge home through day surgery.  ESTIMATED BLOOD LOSS:  Minimal.  COMPLICATIONS:  None.  SPECIMENS:  None.  LN/NUANCE  D:01/15/2018 T:01/15/2018 JOB:[id number]

## 2018-01-16 ENCOUNTER — Other Ambulatory Visit: Payer: Self-pay

## 2018-01-16 ENCOUNTER — Emergency Department (HOSPITAL_COMMUNITY)
Admission: EM | Admit: 2018-01-16 | Discharge: 2018-01-17 | Disposition: A | Discharge status: Home / Self Care | Payer: Medicare Other | Attending: Emergency Medicine | Admitting: Emergency Medicine

## 2018-01-16 ENCOUNTER — Encounter (HOSPITAL_COMMUNITY): Payer: Self-pay | Admitting: Oral Surgery

## 2018-01-16 DIAGNOSIS — R202 Paresthesia of skin: Secondary | ICD-10-CM | POA: Insufficient documentation

## 2018-01-16 DIAGNOSIS — I10 Essential (primary) hypertension: Secondary | ICD-10-CM | POA: Insufficient documentation

## 2018-01-16 DIAGNOSIS — Z87891 Personal history of nicotine dependence: Secondary | ICD-10-CM | POA: Diagnosis not present

## 2018-01-16 DIAGNOSIS — I2510 Atherosclerotic heart disease of native coronary artery without angina pectoris: Secondary | ICD-10-CM | POA: Insufficient documentation

## 2018-01-16 DIAGNOSIS — Z79899 Other long term (current) drug therapy: Secondary | ICD-10-CM | POA: Insufficient documentation

## 2018-01-16 DIAGNOSIS — R200 Anesthesia of skin: Secondary | ICD-10-CM | POA: Diagnosis present

## 2018-01-16 DIAGNOSIS — R1013 Epigastric pain: Secondary | ICD-10-CM | POA: Diagnosis not present

## 2018-01-16 DIAGNOSIS — Z8541 Personal history of malignant neoplasm of cervix uteri: Secondary | ICD-10-CM | POA: Insufficient documentation

## 2018-01-16 LAB — CBC
HEMATOCRIT: 31.3 % — AB (ref 36.0–46.0)
HEMOGLOBIN: 9.9 g/dL — AB (ref 12.0–15.0)
MCH: 28.6 pg (ref 26.0–34.0)
MCHC: 31.6 g/dL (ref 30.0–36.0)
MCV: 90.5 fL (ref 80.0–100.0)
PLATELETS: 261 10*3/uL (ref 150–400)
RBC: 3.46 MIL/uL — AB (ref 3.87–5.11)
RDW: 12.6 % (ref 11.5–15.5)
WBC: 6.7 10*3/uL (ref 4.0–10.5)
nRBC: 0 % (ref 0.0–0.2)

## 2018-01-16 LAB — COMPREHENSIVE METABOLIC PANEL
ALT: 17 U/L (ref 0–44)
ANION GAP: 8 (ref 5–15)
AST: 29 U/L (ref 15–41)
Albumin: 4.1 g/dL (ref 3.5–5.0)
Alkaline Phosphatase: 37 U/L — ABNORMAL LOW (ref 38–126)
BUN: 17 mg/dL (ref 8–23)
CHLORIDE: 101 mmol/L (ref 98–111)
CO2: 28 mmol/L (ref 22–32)
Calcium: 9.4 mg/dL (ref 8.9–10.3)
Creatinine, Ser: 0.93 mg/dL (ref 0.44–1.00)
GFR calc non Af Amer: >60 mL/min (ref >60)
Glucose, Bld: 99 mg/dL (ref 70–99)
POTASSIUM: 3.7 mmol/L (ref 3.5–5.1)
SODIUM: 137 mmol/L (ref 135–145)
Total Bilirubin: 0.5 mg/dL (ref 0.3–1.2)
Total Protein: 6.5 g/dL (ref 6.5–8.1)

## 2018-01-16 NOTE — ED Notes (Signed)
Results reviewed, no changes in acuity at this time 

## 2018-01-16 NOTE — ED Triage Notes (Addendum)
Patient had all of her teeth pulled yesterday, c/o pain "all over" today (was prescribed percocet but had taken only two today - last dose 4pm). Also has chronic neuropathy and c/o hand/feet numbness and had abdominal pain. States that a "lot of blood went into my stomach"; was put under general anesthesia for tooth/bone scraping removal.

## 2018-01-17 ENCOUNTER — Emergency Department (HOSPITAL_COMMUNITY): Payer: Medicare Other

## 2018-01-17 DIAGNOSIS — R202 Paresthesia of skin: Secondary | ICD-10-CM | POA: Diagnosis not present

## 2018-01-17 LAB — TROPONIN I

## 2018-01-17 NOTE — ED Notes (Signed)
Patient transported to CT 

## 2018-01-17 NOTE — ED Notes (Signed)
Patient verbalizes understanding of discharge instructions. Opportunity for questioning and answers were provided. Armband removed by staff, pt discharged from ED ambulatory.   

## 2018-01-17 NOTE — ED Notes (Signed)
Pt states that at 4pm her R hand began to go numb, no drift noted but weakness in hand, neuro intact otherwise. Pt states that she felt nauseated at the time

## 2018-01-17 NOTE — ED Notes (Signed)
Pt taken to MRI  

## 2018-01-17 NOTE — ED Provider Notes (Addendum)
[facility] EMERGENCY DEPARTMENT Provider Note   CSN: [id number] Arrival date & time: 01/16/18  2117     History   Chief Complaint Chief Complaint  Patient presents with  . Numbness    HPI Haley Long is a 67 y.o. female.  HPI  67 year old female comes in with chief complaint of abdominal pain and hand numbness. Patient has remote history of uterine cancers, status post resection and peripheral vascular disease and irregular heartbeat plus hypertension.  Patient reports that around 4 PM she started having epigastric abdominal discomfort.  Pain is described as something churning internally, and the pain is intermittent without any specific evoking, aggravating or relieving factors.  Patient has no history of similar pain in the past.  Pain is nonradiating and she denies any nausea, vomiting, diarrhea, fevers or chills.  Additionally, patient is complaining of right-sided hand numbness.  Those symptoms started around 8 PM, and she has a constant tingling sensation in her right hand.  Patient thinks that she has mild weakness with her grip strength.  Patient is right-handed and there is no history of stroke or CAD.  Past Medical History:  Diagnosis Date  . Arthritis   . Cancer ([place])    uterine  . Coronary artery disease   . Heart murmur    had forever- not a problem  . Hypertension   . Irregular heart beat   . Peripheral vascular disease ([place])   . Varicose veins     Patient Active Problem List   Diagnosis Date Noted  . Varicose veins of lower extremities with other complications 28/41/3244  . Irregular heart beat     Past Surgical History:  Procedure Laterality Date  . ABDOMINAL HYSTERECTOMY    . CATARACT EXTRACTION, BILATERAL    . CHOLECYSTECTOMY    . ENDOVENOUS ABLATION SAPHENOUS VEIN W/ LASER Left 04-21-2013   left greater saphenous vein and sclerotherapy left leg  . EYE SURGERY    . PELVIC EXENTERATION    . TOOTH EXTRACTION N/A  01/15/2018   Procedure: DENTAL RESTORATION/EXTRACTIONS;  Surgeon: Diona Browner, DDS;  Location: [facility];  Service: Oral Surgery;  Laterality: N/A;     OB History   None      Home Medications    Prior to Admission medications   Medication Sig Start Date End Date Taking? Authorizing Provider  amoxicillin (AMOXIL) 500 MG capsule Take 1 capsule (500 mg total) by mouth 3 (three) times daily. 01/15/18   Diona Browner, DDS  cholecalciferol (VITAMIN D) 1000 units tablet Take 1,000 Units by mouth daily.    [provider]  ferrous sulfate 325 (65 FE) MG tablet Take 1 tablet by mouth daily.    [provider]  furosemide (LASIX) 20 MG tablet Take 1 tablet (20 mg total) by mouth daily. Patient not taking: Reported on 01/11/2018 03/20/16   Tanna Furry, MD  HYDROcodone-acetaminophen (NORCO/VICODIN) 5-325 MG tablet Take 1 tablet by mouth 4 (four) times daily as needed for moderate pain.    [provider]  lisinopril-hydrochlorothiazide (PRINZIDE,ZESTORETIC) 10-12.5 MG tablet Take 1 tablet by mouth daily.    [provider]  Multiple Vitamin (MULITIVITAMIN WITH MINERALS) TABS Take 1 tablet by mouth daily.    [provider]  omeprazole (PRILOSEC) 20 MG capsule Take 20 mg by mouth daily.    [provider]  oxyCODONE-acetaminophen (PERCOCET) 5-325 MG tablet Take 1 tablet by mouth every 4 (four) hours as needed. 01/15/18   Diona Browner, DDS  pregabalin (LYRICA) 75 MG capsule Take 75 mg by mouth 3 (three) times daily as needed.     [provider]    Family History Family History  Problem Relation Age of Onset  . Hyperlipidemia Mother   . Hypertension Mother   . Other Mother        varicose veins  . Diabetes Father   . Hyperlipidemia Father   . Hypertension Father   . Peripheral vascular disease Father   . Diabetes Sister   . Hyperlipidemia Sister   . Hypertension Sister   . Other Sister        varicose veins  . Hyperlipidemia  Brother   . Hypertension Brother   . Peripheral vascular disease Brother   . Heart disease Daughter        before age 32    Social History Social History   Tobacco Use  . Smoking status: Former Smoker    Types: Cigarettes    Last attempt to quit: 03/31/1969    Years since quitting: 48.8  . Smokeless tobacco: Former Network engineer Use Topics  . Alcohol use: Yes  . Drug use: No     Allergies   Clonazepam and Tramadol   Review of Systems Review of Systems  Constitutional: Positive for activity change.  Respiratory: Negative for shortness of breath.   Cardiovascular: Negative for chest pain.  Gastrointestinal: Positive for abdominal pain. Negative for nausea and vomiting.  Neurological: Positive for weakness and numbness. Negative for dizziness, facial asymmetry and headaches.  All other systems reviewed and are negative.    Physical Exam Updated Vital Signs BP 132/89   Pulse 66   Temp 98.4 F (36.9 C) (Oral)   Resp 17   SpO2 100%   Physical Exam  Constitutional: She is oriented to person, place, and time. She appears well-developed.  HENT:  Head: Normocephalic and atraumatic.  Eyes: Pupils are equal, round, and reactive to light. EOM are normal.  Neck: Normal range of motion. Neck supple.  Cardiovascular: Normal rate and regular rhythm.  Pulmonary/Chest: Effort normal.  Abdominal: Soft. Bowel sounds are normal. There is no tenderness.  Neurological: She is alert and oriented to person, place, and time.  Subjective numbness over the right hand. Upper extremity and lower extremity strength is 4+ out of 5 Extraocular muscles are intact and there is no facial asymmetry  Skin: Skin is warm and dry.  Nursing note and vitals reviewed.    ED Treatments / Results  Labs (all labs ordered are listed, but only abnormal results are displayed) Labs Reviewed  COMPREHENSIVE METABOLIC PANEL - Abnormal; Notable for the following components:      Result Value   Alkaline  Phosphatase 37 (*)    All other components within normal limits  CBC - Abnormal; Notable for the following components:   RBC 3.46 (*)    Hemoglobin 9.9 (*)    HCT 31.3 (*)    All other components within normal limits  TROPONIN I    EKG EKG Interpretation  Date/Time:  Sunday January 17 2018 01:23:23 EDT Ventricular Rate:  70 PR Interval:    QRS Duration: 124 QT Interval:  400 QTC Calculation: 432 R Axis:   21 Text Interpretation:  Sinus rhythm Nonspecific intraventricular conduction delay No acute changes Confirmed by Varney Biles [phone]) on 01/17/2018 1:36:56 AM   Radiology Ct Head Wo Contrast  Result Date: 01/17/2018 CLINICAL DATA:  Focal neuro deficit, < 6 hrs, stroke suspected. Right arm numbness. EXAM:  CT HEAD WITHOUT CONTRAST TECHNIQUE: Contiguous axial images were obtained from the base of the skull through the vertex without intravenous contrast. COMPARISON:  None. FINDINGS: Brain: Normal for age atrophy. Mild chronic small vessel ischemia. No intracranial hemorrhage, mass effect, or midline shift. No hydrocephalus. The basilar cisterns are patent. No evidence of territorial infarct or acute ischemia. No extra-axial or intracranial fluid collection. Vascular: Atherosclerosis of skullbase vasculature without hyperdense vessel or abnormal calcification. Skull: No fracture or focal lesion. Sinuses/Orbits: Mucosal thickening of both maxillary sinuses without fluid level. Bilateral cataract resection. Mastoid air cells are clear. Other: None. IMPRESSION: 1.  No acute intracranial abnormality. 2. Normal for age atrophy with mild chronic small vessel ischemia. Electronically Signed   By: Keith Rake M.D.   On: 01/17/2018 02:26    Procedures Procedures (including critical care time)  Medications Ordered in ED Medications - No data to display   Initial Impression / Assessment and Plan / ED Course  I have reviewed the triage vital signs and the nursing notes.  Pertinent  labs & imaging results that were available during my care of the patient were reviewed by me and considered in my medical decision making (see chart for details).  Clinical Course as of Jan 18 652  Sun Jan 17, 2018  5465 Patient declined MRI. She states that she was unable to tolerate the noise.   Patient doesn't want MRI.  Patient understands that her actions will lead to inadequate medical workup, and that she is at risk of complications of missed diagnosis, which includes morbidity and mortality.  Alternative options discussed - Ativan iv to sedate. Opportunity to change mind given. Pt still declined the MRI.   Patient is demonstrating good capacity to make decision. Patient understands that she needs to return to the ER immediately if her symptoms get worse.  She reports that her numbness has now resolved.    [AN]    Clinical Course User Index [AN] Varney Biles, MD    67 year old female comes in with chief complaint of numbness. Patient reports that she started having numbness in her right hand around 5-1/2 hours prior to my evaluation.  Her symptoms are limited to the hands only.  There is no neck pain and she denies any history of strokes.  Neuro exam is nonfocal.  MRI has been ordered along with CT scan of the head to ensure there was no small stroke.  Additionally, neuropathy is in the differential diagnosis if the MRI is negative.  Patient is also complaining of intermittent abdominal discomfort.  On my exam there is no tenderness at all over her abdomen.  The pain appears to be intermittent.  Vital signs show normal white count and 0 sirs criteria on vital signs.  I do not think any further work-up is needed on the abdomen time from emergency perspective.  Final Clinical Impressions(s) / ED Diagnoses   Final diagnoses:  Paresthesia    ED Discharge Orders    None       Varney Biles, MD 01/17/18 [place], Kali Deadwyler, MD 01/17/18 [phone]

## 2018-01-17 NOTE — Discharge Instructions (Addendum)
We saw in the ER for the numbness.  It seems like your numbness has resolved. CT scan in the ER was normal.  We wanted to get an MRI to ensure that you did not have a small stroke, however you have declined the MRI for now.  We recommend that you follow-up with your primary care doctor as soon as possible. If your symptoms return, please return to the ER immediately.

## 2018-03-26 ENCOUNTER — Emergency Department (HOSPITAL_COMMUNITY)
Admission: EM | Admit: 2018-03-26 | Discharge: 2018-03-26 | Disposition: A | Discharge status: Home / Self Care | Payer: Medicare Other | Attending: Emergency Medicine | Admitting: Emergency Medicine

## 2018-03-26 ENCOUNTER — Encounter (HOSPITAL_COMMUNITY): Payer: Self-pay

## 2018-03-26 DIAGNOSIS — S80861A Insect bite (nonvenomous), right lower leg, initial encounter: Secondary | ICD-10-CM | POA: Insufficient documentation

## 2018-03-26 DIAGNOSIS — Z87891 Personal history of nicotine dependence: Secondary | ICD-10-CM | POA: Insufficient documentation

## 2018-03-26 DIAGNOSIS — Y939 Activity, unspecified: Secondary | ICD-10-CM | POA: Insufficient documentation

## 2018-03-26 DIAGNOSIS — I10 Essential (primary) hypertension: Secondary | ICD-10-CM | POA: Diagnosis not present

## 2018-03-26 DIAGNOSIS — Y929 Unspecified place or not applicable: Secondary | ICD-10-CM | POA: Diagnosis not present

## 2018-03-26 DIAGNOSIS — W57XXXA Bitten or stung by nonvenomous insect and other nonvenomous arthropods, initial encounter: Secondary | ICD-10-CM | POA: Insufficient documentation

## 2018-03-26 DIAGNOSIS — Z79899 Other long term (current) drug therapy: Secondary | ICD-10-CM | POA: Diagnosis not present

## 2018-03-26 DIAGNOSIS — I2510 Atherosclerotic heart disease of native coronary artery without angina pectoris: Secondary | ICD-10-CM | POA: Diagnosis not present

## 2018-03-26 DIAGNOSIS — Y998 Other external cause status: Secondary | ICD-10-CM | POA: Diagnosis not present

## 2018-03-26 MED ORDER — DIPHENHYDRAMINE-ZINC ACETATE 2-0.1 % EX CREA: 1.0000 "application " | TOPICAL_CREAM | Freq: Three times a day (TID) | CUTANEOUS | 0 refills | Status: DC | PRN

## 2018-03-26 MED ORDER — PREDNISONE 10 MG (21) PO TBPK: ORAL_TABLET | Freq: Every day | ORAL | 0 refills | Status: DC

## 2018-03-26 NOTE — Discharge Instructions (Signed)
Please follow up with your primary care provider within 5-7 days for re-evaluation of your symptoms. If you do not have a primary care provider, information for a healthcare clinic has been provided for you to make arrangements for follow up care.  Please return to the emergency room immediately if you experience any new or worsening symptoms or any symptoms that indicate worsening infection such as fevers, increased redness/swelling/pain, warmth, or drainage from the affected area.    

## 2018-03-26 NOTE — ED Notes (Signed)
ED Provider at bedside. 

## 2018-03-26 NOTE — ED Triage Notes (Signed)
Pt reports insect bite to her right hand, elbow and right leg while sitting at home. Redness and swelling noted to these areas.

## 2018-03-26 NOTE — ED Provider Notes (Signed)
[place] EMERGENCY DEPARTMENT Provider Note   CSN: [id number] Arrival date & time: 03/26/18  1508     History   Chief Complaint Chief Complaint  Patient presents with  . Insect Bite    HPI Haley Long is a 67 y.o. female.  HPI   Patient is a 67 year old female with a history of cancer, CAD, heart murmur, hypertension, PVD, who presents the emergency department today for evaluation of possible insect bites that occurred last night.  Patient states that she has an insect bite to her right lower leg, right hand and right elbow.  She think she was bit by bedbugs.  She has been bit by bedbugs in the past and has had a similar reaction.  Denies fevers or chills.  Reports that the wounds are itchy.  Not significantly painful.  No drainage of pus.  Past Medical History:  Diagnosis Date  . Arthritis   . Cancer ([place])    uterine  . Coronary artery disease   . Heart murmur    had forever- not a problem  . Hypertension   . Irregular heart beat   . Peripheral vascular disease ([place])   . Varicose veins     Patient Active Problem List   Diagnosis Date Noted  . Varicose veins of lower extremities with other complications 66/44/0347  . Irregular heart beat     Past Surgical History:  Procedure Laterality Date  . ABDOMINAL HYSTERECTOMY    . CATARACT EXTRACTION, BILATERAL    . CHOLECYSTECTOMY    . ENDOVENOUS ABLATION SAPHENOUS VEIN W/ LASER Left 04-21-2013   left greater saphenous vein and sclerotherapy left leg  . EYE SURGERY    . PELVIC EXENTERATION    . TOOTH EXTRACTION N/A 01/15/2018   Procedure: DENTAL RESTORATION/EXTRACTIONS;  Surgeon: Diona Browner, DDS;  Location: [place];  Service: Oral Surgery;  Laterality: N/A;     OB History   No obstetric history on file.      Home Medications    Prior to Admission medications   Medication Sig Start Date End Date Taking? Authorizing Provider  amoxicillin (AMOXIL) 500 MG capsule Take 1 capsule (500 mg  total) by mouth 3 (three) times daily. 01/15/18   Diona Browner, DDS  cholecalciferol (VITAMIN D) 1000 units tablet Take 1,000 Units by mouth daily.    [provider]  diphenhydrAMINE-zinc acetate (BENADRYL EXTRA STRENGTH) cream Apply 1 application topically 3 (three) times daily as needed for itching. 03/26/18   Forrester Blando S, PA-C  ferrous sulfate 325 (65 FE) MG tablet Take 1 tablet by mouth daily.    [provider]  furosemide (LASIX) 20 MG tablet Take 1 tablet (20 mg total) by mouth daily. Patient not taking: Reported on 01/11/2018 03/20/16   Tanna Furry, MD  HYDROcodone-acetaminophen (NORCO/VICODIN) 5-325 MG tablet Take 1 tablet by mouth 4 (four) times daily as needed for moderate pain.    [provider]  lisinopril-hydrochlorothiazide (PRINZIDE,ZESTORETIC) 10-12.5 MG tablet Take 1 tablet by mouth daily.    [provider]  Multiple Vitamin (MULITIVITAMIN WITH MINERALS) TABS Take 1 tablet by mouth daily.    [provider]  omeprazole (PRILOSEC) 20 MG capsule Take 20 mg by mouth daily.    [provider]  oxyCODONE-acetaminophen (PERCOCET) 5-325 MG tablet Take 1 tablet by mouth every 4 (four) hours as needed. 01/15/18   Diona Browner, DDS  predniSONE (STERAPRED UNI-PAK 21 TAB) 10 MG (21) TBPK tablet Take by mouth daily. Take 6  tabs by mouth daily  for 2 days, then 5 tabs for 2 days, then 4 tabs for 2 days, then 3 tabs for 2 days, 2 tabs for 2 days, then 1 tab by mouth daily for 2 days 03/26/18   Carlyn Lemke S, PA-C  pregabalin (LYRICA) 75 MG capsule Take 75 mg by mouth 3 (three) times daily as needed.     [provider]    Family History Family History  Problem Relation Age of Onset  . Hyperlipidemia Mother   . Hypertension Mother   . Other Mother        varicose veins  . Diabetes Father   . Hyperlipidemia Father   . Hypertension Father   . Peripheral vascular disease Father   . Diabetes Sister   .  Hyperlipidemia Sister   . Hypertension Sister   . Other Sister        varicose veins  . Hyperlipidemia Brother   . Hypertension Brother   . Peripheral vascular disease Brother   . Heart disease Daughter        before age 61    Social History Social History   Tobacco Use  . Smoking status: Former Smoker    Types: Cigarettes    Last attempt to quit: 03/31/1969    Years since quitting: 49.0  . Smokeless tobacco: Former Network engineer Use Topics  . Alcohol use: Yes  . Drug use: No     Allergies   Clonazepam and Tramadol   Review of Systems Review of Systems  Constitutional: Negative for chills and fever.  HENT: Negative for ear pain and sore throat.   Eyes: Negative for pain and visual disturbance.  Respiratory: Negative for shortness of breath.   Cardiovascular: Negative for chest pain.  Gastrointestinal: Negative for abdominal pain and vomiting.  Genitourinary: Negative for dysuria and hematuria.  Musculoskeletal: Negative for back pain.  Skin: Positive for color change and rash.  Neurological: Negative for headaches.  All other systems reviewed and are negative.    Physical Exam Updated Vital Signs BP (!) 145/73 (BP Location: Right Arm)   Pulse 71   Temp 98 F (36.7 C) (Oral)   Resp 18   SpO2 99%   Physical Exam Vitals signs and nursing note reviewed.  Constitutional:      General: She is not in acute distress.    Appearance: She is well-developed.  HENT:     Head: Normocephalic and atraumatic.  Eyes:     Conjunctiva/sclera: Conjunctivae normal.  Neck:     Musculoskeletal: Neck supple.  Cardiovascular:     Rate and Rhythm: Normal rate.     Heart sounds: Normal heart sounds. No murmur.  Pulmonary:     Effort: Pulmonary effort is normal.     Breath sounds: Normal breath sounds. No wheezing.  Abdominal:     General: Bowel sounds are normal.     Palpations: Abdomen is soft.     Tenderness: There is no abdominal tenderness.  Musculoskeletal: Normal  range of motion.  Skin:    General: Skin is warm and dry.     Comments: Erythema to the right hand up to the forearm, erythema to the right medial elbow, erythema to the right shin.  No significant warmth, fluctuance or induration noted.  Consistent with localized skin reaction.  No signs of cellulitis.  Neurological:     Mental Status: She is alert.      ED Treatments / Results  Labs (all labs ordered  are listed, but only abnormal results are displayed) Labs Reviewed - No data to display  EKG None  Radiology No results found.  Procedures Procedures (including critical care time)  Medications Ordered in ED Medications - No data to display   Initial Impression / Assessment and Plan / ED Course  I have reviewed the triage vital signs and the nursing notes.  Pertinent labs & imaging results that were available during my care of the patient were reviewed by me and considered in my medical decision making (see chart for details).     Final Clinical Impressions(s) / ED Diagnoses   Final diagnoses:  Insect bite, unspecified site, initial encounter   Patient presenting with multiple insect bites that occurred last night.  Has had bedbugs in the past and is concerned that she is having a similar reaction and may have been exposed to bedbugs again.  She does have some areas of erythema on her arm and leg however there is no sign of cellulitis, suspect localized skin reaction.  Will give Rx for Benadryl cream and send her home with a prescription for steroids.  Have advised her to contact an exterminator.  Have advised her to monitor symptoms and if signs of cellulitis or fevers do develop and she should return to the emergency department.  She voices understanding the plan and reasons to return to the ED.  All questions answered.  ED Discharge Orders         Ordered    diphenhydrAMINE-zinc acetate (BENADRYL EXTRA STRENGTH) cream  3 times daily PRN     03/26/18 1924    predniSONE  (STERAPRED UNI-PAK 21 TAB) 10 MG (21) TBPK tablet  Daily     03/26/18 1924           Rodney Booze, PA-C 03/26/18 Nuckolls, Dan, DO 03/28/18 1510

## 2019-05-08 ENCOUNTER — Emergency Department (HOSPITAL_BASED_OUTPATIENT_CLINIC_OR_DEPARTMENT_OTHER): Payer: Medicare Other

## 2019-05-08 ENCOUNTER — Other Ambulatory Visit: Payer: Self-pay

## 2019-05-08 ENCOUNTER — Emergency Department (HOSPITAL_COMMUNITY): Payer: Medicare Other

## 2019-05-08 ENCOUNTER — Emergency Department (HOSPITAL_COMMUNITY)
Admission: EM | Admit: 2019-05-08 | Discharge: 2019-05-08 | Disposition: A | Discharge status: Home / Self Care | Payer: Medicare Other | Attending: Emergency Medicine | Admitting: Emergency Medicine

## 2019-05-08 ENCOUNTER — Encounter (HOSPITAL_COMMUNITY): Payer: Self-pay | Admitting: Emergency Medicine

## 2019-05-08 DIAGNOSIS — I739 Peripheral vascular disease, unspecified: Secondary | ICD-10-CM | POA: Insufficient documentation

## 2019-05-08 DIAGNOSIS — M7989 Other specified soft tissue disorders: Secondary | ICD-10-CM

## 2019-05-08 DIAGNOSIS — N3000 Acute cystitis without hematuria: Secondary | ICD-10-CM | POA: Diagnosis not present

## 2019-05-08 DIAGNOSIS — I10 Essential (primary) hypertension: Secondary | ICD-10-CM | POA: Diagnosis not present

## 2019-05-08 DIAGNOSIS — M545 Low back pain: Secondary | ICD-10-CM | POA: Diagnosis present

## 2019-05-08 DIAGNOSIS — R2243 Localized swelling, mass and lump, lower limb, bilateral: Secondary | ICD-10-CM | POA: Insufficient documentation

## 2019-05-08 DIAGNOSIS — Z87891 Personal history of nicotine dependence: Secondary | ICD-10-CM | POA: Diagnosis not present

## 2019-05-08 DIAGNOSIS — I2510 Atherosclerotic heart disease of native coronary artery without angina pectoris: Secondary | ICD-10-CM | POA: Diagnosis not present

## 2019-05-08 HISTORY — DX: Calculus of kidney: N20.0

## 2019-05-08 LAB — COMPREHENSIVE METABOLIC PANEL
ALT: 16 U/L (ref 0–44)
AST: 23 U/L (ref 15–41)
Albumin: 3.8 g/dL (ref 3.5–5.0)
Alkaline Phosphatase: 45 U/L (ref 38–126)
Anion gap: 11 (ref 5–15)
BUN: 7 mg/dL — ABNORMAL LOW (ref 8–23)
CO2: 24 mmol/L (ref 22–32)
Calcium: 9.3 mg/dL (ref 8.9–10.3)
Chloride: 95 mmol/L — ABNORMAL LOW (ref 98–111)
Creatinine, Ser: 0.63 mg/dL (ref 0.44–1.00)
GFR calc Af Amer: >60 mL/min (ref >60)
GFR calc non Af Amer: >60 mL/min (ref >60)
Glucose, Bld: 96 mg/dL (ref 70–99)
Potassium: 3.9 mmol/L (ref 3.5–5.1)
Sodium: 130 mmol/L — ABNORMAL LOW (ref 135–145)
Total Bilirubin: 0.3 mg/dL (ref 0.3–1.2)
Total Protein: 6.5 g/dL (ref 6.5–8.1)

## 2019-05-08 LAB — CBC
HCT: 31.4 % — ABNORMAL LOW (ref 36.0–46.0)
Hemoglobin: 10.5 g/dL — ABNORMAL LOW (ref 12.0–15.0)
MCH: 29.9 pg (ref 26.0–34.0)
MCHC: 33.4 g/dL (ref 30.0–36.0)
MCV: 89.5 fL (ref 80.0–100.0)
Platelets: 243 10*3/uL (ref 150–400)
RBC: 3.51 MIL/uL — ABNORMAL LOW (ref 3.87–5.11)
RDW: 11.9 % (ref 11.5–15.5)
WBC: 3.5 10*3/uL — ABNORMAL LOW (ref 4.0–10.5)
nRBC: 0 % (ref 0.0–0.2)

## 2019-05-08 LAB — URINALYSIS, ROUTINE W REFLEX MICROSCOPIC
Bacteria, UA: NONE SEEN
Bilirubin Urine: NEGATIVE
Glucose, UA: NEGATIVE mg/dL
Hgb urine dipstick: NEGATIVE
Ketones, ur: NEGATIVE mg/dL
Nitrite: NEGATIVE
Protein, ur: NEGATIVE mg/dL
Specific Gravity, Urine: 1.005 (ref 1.005–1.030)
pH: 6 (ref 5.0–8.0)

## 2019-05-08 LAB — LIPASE, BLOOD: Lipase: 35 U/L (ref 11–51)

## 2019-05-08 MED ORDER — CEPHALEXIN 250 MG PO CAPS
500.0000 mg | ORAL_CAPSULE | Freq: Once | ORAL | Status: AC
  Administered 2019-05-08: 500 mg via ORAL
  Filled 2019-05-08: qty 2

## 2019-05-08 MED ORDER — CEPHALEXIN 500 MG PO CAPS: 500.0000 mg | ORAL_CAPSULE | Freq: Four times a day (QID) | ORAL | 0 refills | Status: AC

## 2019-05-08 MED ORDER — FENTANYL CITRATE (PF) 100 MCG/2ML IJ SOLN
50.0000 ug | Freq: Once | INTRAMUSCULAR | Status: AC
  Administered 2019-05-08: 50 ug via INTRAVENOUS
  Filled 2019-05-08: qty 2

## 2019-05-08 MED ORDER — SODIUM CHLORIDE 0.9% FLUSH
3.0000 mL | Freq: Once | INTRAVENOUS | Status: AC
  Administered 2019-05-08: 3 mL via INTRAVENOUS

## 2019-05-08 MED ORDER — ONDANSETRON HCL 4 MG/2ML IJ SOLN
4.0000 mg | Freq: Once | INTRAMUSCULAR | Status: AC
  Administered 2019-05-08: 4 mg via INTRAVENOUS
  Filled 2019-05-08: qty 2

## 2019-05-08 MED ORDER — IOHEXOL 300 MG/ML  SOLN
100.0000 mL | Freq: Once | INTRAMUSCULAR | Status: AC | PRN
  Administered 2019-05-08: 100 mL via INTRAVENOUS

## 2019-05-08 NOTE — Discharge Instructions (Signed)
Take antibiotics as directed. Please take all of your antibiotics until finished.  Follow up with your primary care doctor as previously scheduled.   Return to the Emergency Department immediately if you experience any worsening abdominal pain, fever, persistent nausea and vomiting, inability keep any food down, pain with urination, blood in your urine or any other worsening or concerning symptoms.

## 2019-05-08 NOTE — ED Triage Notes (Signed)
Pt to triage via [facility] from home.  C/o lower back pain x 2 days that is radiating to pelvis with nausea and vomiting x 1 today.  Generalized abd tenderness.  States "weird" smell to urine this morning.  Hx of kidney stones.  Denies dysuria.

## 2019-05-08 NOTE — ED Notes (Signed)
Patient verbalizes understanding of discharge instructions. Opportunity for questioning and answers were provided.  pt discharged from ED, ambulatory by self   

## 2019-05-08 NOTE — ED Provider Notes (Signed)
[facility] EMERGENCY DEPARTMENT Provider Note   CSN: [id number] Arrival date & time: 05/08/19  1437     History Chief Complaint  Patient presents with  . Back Pain  . Abdominal Pain    Haley Long is a 69 y.o. female past medical history of uterine cancer, CAD, hypertension, kidney stones, peripheral vascular disease who presents for evaluation of 2 days of lower back pain, lower abdominal pain.  She reports associated nausea and one episode of vomiting yesterday.  No blood noted in vomiting.  No emesis since.  She states that she has not taken any medications for the pain.  She initially was going to wait it out because she has an appointment with her primary care doctor on the 12th but states that her symptoms were worsening today, prompting ED visit.  She states she has also felt like her urine was foul-smelling.  No associated dysuria, hematuria.  No fevers.  She denies any chest pain, difficulty breathing, cough.  She states she is also concerned about her legs.  She states that she normally has swelling in both bilateral lower extremities.  She does feel like over the last couple days, she they have both gotten red and hot.  She states that she has not had any recent travel.  Her sister was recently diagnosed with Covid about 3 to 4 weeks ago and she has not seen her in that time span.  The history is provided by the patient.       Past Medical History:  Diagnosis Date  . Arthritis   . Cancer ([place])    uterine  . Coronary artery disease   . Heart murmur    had forever- not a problem  . Hypertension   . Irregular heart beat   . Kidney stones   . Peripheral vascular disease ([place])   . Varicose veins     Patient Active Problem List   Diagnosis Date Noted  . Varicose veins of lower extremities with other complications [id number]  . Irregular heart beat     Past Surgical History:  Procedure Laterality Date  . ABDOMINAL HYSTERECTOMY    . CATARACT  EXTRACTION, BILATERAL    . CHOLECYSTECTOMY    . ENDOVENOUS ABLATION SAPHENOUS VEIN W/ LASER Left 04-21-2013   left greater saphenous vein and sclerotherapy left leg  . EYE SURGERY    . PELVIC EXENTERATION    . TOOTH EXTRACTION N/A 01/15/2018   Procedure: DENTAL RESTORATION/EXTRACTIONS;  Surgeon: Diona Browner, DDS;  Location: [facility];  Service: Oral Surgery;  Laterality: N/A;     OB History   No obstetric history on file.     Family History  Problem Relation Age of Onset  . Hyperlipidemia Mother   . Hypertension Mother   . Other Mother        varicose veins  . Diabetes Father   . Hyperlipidemia Father   . Hypertension Father   . Peripheral vascular disease Father   . Diabetes Sister   . Hyperlipidemia Sister   . Hypertension Sister   . Other Sister        varicose veins  . Hyperlipidemia Brother   . Hypertension Brother   . Peripheral vascular disease Brother   . Heart disease Daughter        before age 52    Social History   Tobacco Use  . Smoking status: Former Smoker    Types: Cigarettes    Quit date: 03/31/1969  Years since quitting: 50.1  . Smokeless tobacco: Former Network engineer Use Topics  . Alcohol use: Yes  . Drug use: No    Home Medications Prior to Admission medications   Medication Sig Start Date End Date Taking? Authorizing Provider  amoxicillin (AMOXIL) 500 MG capsule Take 1 capsule (500 mg total) by mouth 3 (three) times daily. 01/15/18   Diona Browner, DDS  cephALEXin (KEFLEX) 500 MG capsule Take 1 capsule (500 mg total) by mouth 4 (four) times daily for 7 days. 05/08/19 05/15/19  Volanda Napoleon, PA-C  cholecalciferol (VITAMIN D) 1000 units tablet Take 1,000 Units by mouth daily.    [provider]  diphenhydrAMINE-zinc acetate (BENADRYL EXTRA STRENGTH) cream Apply 1 application topically 3 (three) times daily as needed for itching. 03/26/18   Couture, Cortni S, PA-C  ferrous sulfate 325 (65 FE) MG tablet Take 1 tablet by mouth daily.     [provider]  furosemide (LASIX) 20 MG tablet Take 1 tablet (20 mg total) by mouth daily. Patient not taking: Reported on 01/11/2018 03/20/16   Tanna Furry, MD  HYDROcodone-acetaminophen (NORCO/VICODIN) 5-325 MG tablet Take 1 tablet by mouth 4 (four) times daily as needed for moderate pain.    [provider]  lisinopril-hydrochlorothiazide (PRINZIDE,ZESTORETIC) 10-12.5 MG tablet Take 1 tablet by mouth daily.    [provider]  Multiple Vitamin (MULITIVITAMIN WITH MINERALS) TABS Take 1 tablet by mouth daily.    [provider]  omeprazole (PRILOSEC) 20 MG capsule Take 20 mg by mouth daily.    [provider]  oxyCODONE-acetaminophen (PERCOCET) 5-325 MG tablet Take 1 tablet by mouth every 4 (four) hours as needed. 01/15/18   Diona Browner, DDS  predniSONE (STERAPRED UNI-PAK 21 TAB) 10 MG (21) TBPK tablet Take by mouth daily. Take 6 tabs by mouth daily  for 2 days, then 5 tabs for 2 days, then 4 tabs for 2 days, then 3 tabs for 2 days, 2 tabs for 2 days, then 1 tab by mouth daily for 2 days 03/26/18   Couture, Cortni S, PA-C  pregabalin (LYRICA) 75 MG capsule Take 75 mg by mouth 3 (three) times daily as needed.     [provider]    Allergies    Clonazepam and Tramadol  Review of Systems   Review of Systems  Constitutional: Negative for fever.  Respiratory: Negative for cough and shortness of breath.   Cardiovascular: Negative for chest pain.  Gastrointestinal: Positive for abdominal pain and nausea. Negative for vomiting.  Genitourinary: Negative for dysuria and hematuria.  Neurological: Negative for headaches.  All other systems reviewed and are negative.   Physical Exam Updated Vital Signs BP (!) 139/57 (BP Location: Left Arm)   Pulse 68   Temp 98 F (36.7 C) (Oral)   Resp 18   SpO2 99%   Physical Exam Vitals and nursing note reviewed.  Constitutional:      Appearance: Normal appearance. She is well-developed.  HENT:      Head: Normocephalic and atraumatic.  Eyes:     General: Lids are normal.     Conjunctiva/sclera: Conjunctivae normal.     Pupils: Pupils are equal, round, and reactive to light.  Cardiovascular:     Rate and Rhythm: Normal rate and regular rhythm.     Pulses: Normal pulses.     Heart sounds: Normal heart sounds. No murmur. No friction rub. No gallop.   Pulmonary:     Effort: Pulmonary effort is normal.  Breath sounds: Normal breath sounds.     Comments: Lungs clear to auscultation bilaterally.  Symmetric chest rise.  No wheezing, rales, rhonchi. Abdominal:     Palpations: Abdomen is soft. Abdomen is not rigid.     Tenderness: There is abdominal tenderness in the right lower quadrant, suprapubic area and left lower quadrant. There is no guarding.     Comments: Abdomen soft, nondistended.  Mild tenderness palpation lower abdomen with no focal point.  No rigidity, guarding.  Musculoskeletal:        General: Normal range of motion.     Cervical back: Full passive range of motion without pain.  Skin:    General: Skin is warm and dry.     Capillary Refill: Capillary refill takes less than 2 seconds.     Comments: 1+ pitting edema noted bilateral lower extremities.  Anterior aspect of bilateral lower extremities is erythematous.  The left lower extremity does appear slightly warm to touch.  Neurological:     Mental Status: She is alert and oriented to person, place, and time.  Psychiatric:        Speech: Speech normal.     ED Results / Procedures / Treatments   Labs (all labs ordered are listed, but only abnormal results are displayed) Labs Reviewed  COMPREHENSIVE METABOLIC PANEL - Abnormal; Notable for the following components:      Result Value   Sodium 130 (*)    Chloride 95 (*)    BUN 7 (*)    All other components within normal limits  CBC - Abnormal; Notable for the following components:   WBC 3.5 (*)    RBC 3.51 (*)    Hemoglobin 10.5 (*)    HCT 31.4 (*)    All  other components within normal limits  URINALYSIS, ROUTINE W REFLEX MICROSCOPIC - Abnormal; Notable for the following components:   Leukocytes,Ua TRACE (*)    All other components within normal limits  LIPASE, BLOOD    EKG None  Radiology CT ABDOMEN PELVIS W CONTRAST  Result Date: 05/08/2019 CLINICAL DATA:  Lower back pain and abdominal pain EXAM: CT ABDOMEN AND PELVIS WITH CONTRAST TECHNIQUE: Multidetector CT imaging of the abdomen and pelvis was performed using the standard protocol following bolus administration of intravenous contrast. CONTRAST:  134mL OMNIPAQUE IOHEXOL 300 MG/ML  SOLN COMPARISON:  September 04, 2008 FINDINGS: Lower chest: The visualized heart size within normal limits. No pericardial fluid/thickening. No hiatal hernia. The visualized portions of the lungs are clear. Hepatobiliary: The liver is normal in density without focal abnormality.The main portal vein is patent. The patient is status post cholecystectomy. No biliary ductal dilation. Pancreas: Unremarkable. No pancreatic ductal dilatation or surrounding inflammatory changes. Surgical clips seen posterior to the pancreatic head. Spleen: Normal in size without focal abnormality. Adrenals/Urinary Tract: Both adrenal glands appear normal. The kidneys and collecting system appear normal without evidence of urinary tract calculus or hydronephrosis. There appears to be mild diffuse bladder wall thickening with mild fat stranding changes seen superiorly. Small foci of air are seen within the superior bladder. Stomach/Bowel: The stomach, small bowel, and colon are normal in appearance. No inflammatory changes, wall thickening, or obstructive findings.Scattered colonic diverticula are noted without diverticulitis. Surgical suture seen within the right lower quadrant. Vascular/Lymphatic: There are no enlarged mesenteric, retroperitoneal, or pelvic lymph nodes. Scattered aortic atherosclerotic calcifications are seen without aneurysmal  dilatation. Reproductive: The patient is status post hysterectomy. No adnexal masses or collections seen. Other: No evidence of abdominal wall  mass or hernia. Musculoskeletal: No acute or significant osseous findings. IMPRESSION: Findings which could be suggestive of acute cystitis. Please correlate the patient's laboratory exam. Diverticulosis without diverticulitis. Aortic Atherosclerosis (ICD10-I70.0). Electronically Signed   By: Prudencio Pair M.D.   On: 05/08/2019 19:57   VAS [place] LOWER EXTREMITY VENOUS (DVT)  Result Date: 05/08/2019  Lower Venous DVTStudy Indications: Edema, and Erythema.  Comparison Study: Prior Bilateral LEV done 05/12/17 Performing Technologist: Sharion Dove RVS  Examination Guidelines: A complete evaluation includes B-mode imaging, spectral Doppler, color Doppler, and power Doppler as needed of all accessible portions of each vessel. Bilateral testing is considered an integral part of a complete examination. Limited examinations for reoccurring indications may be performed as noted. The reflux portion of the exam is performed with the patient in reverse Trendelenburg.  +-----+---------------+---------+-----------+----------+--------------+ RIGHTCompressibilityPhasicitySpontaneityPropertiesThrombus Aging +-----+---------------+---------+-----------+----------+--------------+ CFV  Full           Yes      Yes                                 +-----+---------------+---------+-----------+----------+--------------+   +---------+---------------+---------+-----------+----------+--------------+ LEFT     CompressibilityPhasicitySpontaneityPropertiesThrombus Aging +---------+---------------+---------+-----------+----------+--------------+ CFV      Full           Yes      Yes                                 +---------+---------------+---------+-----------+----------+--------------+ SFJ      Full                                                         +---------+---------------+---------+-----------+----------+--------------+ FV Prox  Full                                                        +---------+---------------+---------+-----------+----------+--------------+ FV Mid   Full                                                        +---------+---------------+---------+-----------+----------+--------------+ FV DistalFull                                                        +---------+---------------+---------+-----------+----------+--------------+ PFV      Full                                                        +---------+---------------+---------+-----------+----------+--------------+ POP      Full           Yes      Yes                                 +---------+---------------+---------+-----------+----------+--------------+  PTV      Full                                                        +---------+---------------+---------+-----------+----------+--------------+ PERO     Full                                                        +---------+---------------+---------+-----------+----------+--------------+     Summary: RIGHT: - No evidence of deep vein thrombosis in the lower extremity. No indirect evidence of obstruction proximal to the inguinal ligament.  LEFT: - Findings appear essentially unchanged compared to previous examination. - There is no evidence of deep vein thrombosis in the lower extremity.  *See table(s) above for measurements and observations.    Preliminary     Procedures Procedures (including critical care time)  Medications Ordered in ED Medications  sodium chloride flush (NS) 0.9 % injection 3 mL (3 mLs Intravenous Given 05/08/19 2009)  iohexol (OMNIPAQUE) 300 MG/ML solution 100 mL (100 mLs Intravenous Contrast Given 05/08/19 1944)  fentaNYL (SUBLIMAZE) injection 50 mcg (50 mcg Intravenous Given 05/08/19 2006)  ondansetron (ZOFRAN) injection 4 mg (4 mg Intravenous Given  05/08/19 2006)  cephALEXin (KEFLEX) capsule 500 mg (500 mg Oral Given 05/08/19 2005)    ED Course  I have reviewed the triage vital signs and the nursing notes.  Pertinent labs & imaging results that were available during my care of the patient were reviewed by me and considered in my medical decision making (see chart for details).    MDM Rules/Calculators/A&P                      69 year old female who presents for evaluation of abdominal pain, nausea/vomiting x2 days.  Also feels like her bilateral lower extremities are red.  She does always have swelling but states that the redness is new.  No fevers.  No chest pain, difficulty breathing.  On initial ED arrival, she is afebrile, nontoxic-appearing.  Vital signs are stable.  On exam, she has tenderness palpation of her abdomen.  Concern for infectious etiology versus viral illness.  Plan to check labs.  UA shows trace leukocytes.  Lipase unremarkable.  CMP shows normal BUN and creatinine.  CBC shows slight leukopenia of 3.5.  Hemoglobin stable at 10.5.  DVT study negative for DVT.  CT on pelvis shows findings consistent with acute cystitis.  She has evidence of diverticulosis without acute diverticulitis.  Given that she is having some back pain and feels like she has some lower abdominal pain, will plan to treat as pyelonephritis.  Patient with allergies to clonazepam and tramadol.  We will plan to treat her with Keflex.  Additionally, she does have some degree of cellulitis on her left lower extremity, this will cover for it.  Repeat abdominal exam.  Patient reports improved tenderness.  Abdominal exam improved.  She has been able to tolerate p.o. without any difficulty.  Patient given dose of antibiotics here.  At this time, patient is tolerating p.o., has a reassuring abdominal exam, patient stable for discharge. Discussed patient with Dr. Kathrynn Humble who is agreeable to plan. At this time, patient  exhibits no emergent life-threatening condition  that require further evaluation in ED or admission. Patient had ample opportunity for questions and discussion. All patient's questions were answered with full understanding. Strict return precautions discussed. Patient expresses understanding and agreement to plan.   Portions of this note were generated with [employer]. Dictation errors may occur despite best attempts at proofreading.   Final Clinical Impression(s) / ED Diagnoses Final diagnoses:  Acute cystitis without hematuria    Rx / DC Orders ED Discharge Orders         Ordered    cephALEXin (KEFLEX) 500 MG capsule  4 times daily     05/08/19 2039           Volanda Napoleon, PA-C 05/09/19 [id number]    Varney Biles, MD 05/09/19 1645

## 2019-05-08 NOTE — Progress Notes (Signed)
VASCULAR LAB PRELIMINARY  PRELIMINARY  PRELIMINARY  PRELIMINARY  Left lower extremity venous duplex completed.    Preliminary report:  Se CV proc for preliminary results.  Gave report to Providence Lanius, PA-C  Ziyon Cedotal, RVT 05/08/2019, 6:03 PM

## 2019-06-03 LAB — EXTERNAL GENERIC LAB PROCEDURE: COLOGUARD: NEGATIVE

## 2019-06-03 LAB — COLOGUARD: COLOGUARD: NEGATIVE

## 2019-07-04 ENCOUNTER — Encounter (HOSPITAL_COMMUNITY): Payer: Self-pay | Admitting: Emergency Medicine

## 2019-07-04 ENCOUNTER — Other Ambulatory Visit: Payer: Self-pay

## 2019-07-04 ENCOUNTER — Emergency Department (HOSPITAL_COMMUNITY)
Admission: EM | Admit: 2019-07-04 | Discharge: 2019-07-04 | Disposition: A | Discharge status: Home / Self Care | Payer: Medicare HMO | Attending: Emergency Medicine | Admitting: Emergency Medicine

## 2019-07-04 DIAGNOSIS — Z79899 Other long term (current) drug therapy: Secondary | ICD-10-CM | POA: Diagnosis not present

## 2019-07-04 DIAGNOSIS — N3001 Acute cystitis with hematuria: Secondary | ICD-10-CM | POA: Insufficient documentation

## 2019-07-04 DIAGNOSIS — Z87442 Personal history of urinary calculi: Secondary | ICD-10-CM | POA: Insufficient documentation

## 2019-07-04 DIAGNOSIS — R319 Hematuria, unspecified: Secondary | ICD-10-CM

## 2019-07-04 DIAGNOSIS — I739 Peripheral vascular disease, unspecified: Secondary | ICD-10-CM | POA: Insufficient documentation

## 2019-07-04 DIAGNOSIS — I10 Essential (primary) hypertension: Secondary | ICD-10-CM | POA: Diagnosis not present

## 2019-07-04 DIAGNOSIS — I2510 Atherosclerotic heart disease of native coronary artery without angina pectoris: Secondary | ICD-10-CM | POA: Diagnosis not present

## 2019-07-04 DIAGNOSIS — Z8542 Personal history of malignant neoplasm of other parts of uterus: Secondary | ICD-10-CM | POA: Insufficient documentation

## 2019-07-04 DIAGNOSIS — N3091 Cystitis, unspecified with hematuria: Secondary | ICD-10-CM

## 2019-07-04 LAB — URINALYSIS, ROUTINE W REFLEX MICROSCOPIC
Bilirubin Urine: NEGATIVE
Glucose, UA: NEGATIVE mg/dL
Ketones, ur: NEGATIVE mg/dL
Leukocytes,Ua: NEGATIVE
Nitrite: NEGATIVE
Protein, ur: NEGATIVE mg/dL
RBC / HPF: >50 RBC/hpf — ABNORMAL HIGH (ref 0–5)
Specific Gravity, Urine: 1.005 (ref 1.005–1.030)
pH: 8 (ref 5.0–8.0)

## 2019-07-04 LAB — COMPREHENSIVE METABOLIC PANEL
ALT: 15 U/L (ref 0–44)
AST: 22 U/L (ref 15–41)
Albumin: 4.1 g/dL (ref 3.5–5.0)
Alkaline Phosphatase: 32 U/L — ABNORMAL LOW (ref 38–126)
Anion gap: 9 (ref 5–15)
BUN: 11 mg/dL (ref 8–23)
CO2: 25 mmol/L (ref 22–32)
Calcium: 9.7 mg/dL (ref 8.9–10.3)
Chloride: 98 mmol/L (ref 98–111)
Creatinine, Ser: 0.66 mg/dL (ref 0.44–1.00)
GFR calc Af Amer: >60 mL/min (ref >60)
GFR calc non Af Amer: >60 mL/min (ref >60)
Glucose, Bld: 101 mg/dL — ABNORMAL HIGH (ref 70–99)
Potassium: 4.5 mmol/L (ref 3.5–5.1)
Sodium: 132 mmol/L — ABNORMAL LOW (ref 135–145)
Total Bilirubin: 0.5 mg/dL (ref 0.3–1.2)
Total Protein: 6.5 g/dL (ref 6.5–8.1)

## 2019-07-04 LAB — CBC
HCT: 32 % — ABNORMAL LOW (ref 36.0–46.0)
Hemoglobin: 10.4 g/dL — ABNORMAL LOW (ref 12.0–15.0)
MCH: 29.7 pg (ref 26.0–34.0)
MCHC: 32.5 g/dL (ref 30.0–36.0)
MCV: 91.4 fL (ref 80.0–100.0)
Platelets: 226 10*3/uL (ref 150–400)
RBC: 3.5 MIL/uL — ABNORMAL LOW (ref 3.87–5.11)
RDW: 12.7 % (ref 11.5–15.5)
WBC: 3.6 10*3/uL — ABNORMAL LOW (ref 4.0–10.5)
nRBC: 0 % (ref 0.0–0.2)

## 2019-07-04 LAB — TYPE AND SCREEN
ABO/RH(D): A POS
Antibody Screen: NEGATIVE

## 2019-07-04 LAB — ABO/RH: ABO/RH(D): A POS

## 2019-07-04 MED ORDER — SODIUM CHLORIDE 0.9 % IV SOLN
1.0000 g | Freq: Once | INTRAVENOUS | Status: AC
  Administered 2019-07-04: 1 g via INTRAVENOUS
  Filled 2019-07-04: qty 10

## 2019-07-04 MED ORDER — CEPHALEXIN 500 MG PO CAPS: 500.0000 mg | ORAL_CAPSULE | Freq: Four times a day (QID) | ORAL | 0 refills | Status: DC

## 2019-07-04 NOTE — Discharge Instructions (Addendum)
It was our pleasure to provide your ER care today - we hope that you feel better.  There is blood in your urine - take antibiotic as prescribed for possible urine infection.   On your ct scan from earlier this year, your bladder wall was noted to be thickened - given recurrent blood in urine, this may be from urine infection. Follow up with urologist in the next 1-2 weeks - call number provided to arrange appointment.   Return to ER if worse, new symptoms, high fevers, weak/faint, new or severe pain, or other concern.

## 2019-07-04 NOTE — ED Provider Notes (Signed)
[facility] EMERGENCY DEPARTMENT Provider Note   CSN: [id number] Arrival date & time: 07/04/19  1246     History Chief Complaint  Patient presents with  . Vaginal Bleeding    Haley Long is a 69 y.o. female.  Patient c/o blood in urine. Symptoms acute onset in past day, moderate, episodic, persistent. States at first she wasn't sure if blood was coming from vagina or rectum, but now thinks it is in her urine. ?mild dysuria. +mild suprapubic discomfort, no other abd or flank pain. No fever or chills. No nausea/vomiting. No other abnormal bruising or bleeding, denies anticoag use.  No faintness, or dizziness. No recent trauma/fall.    The history is provided by the patient.  Vaginal Bleeding Associated symptoms: no abdominal pain, no back pain and no fever        Past Medical History:  Diagnosis Date  . Arthritis   . Cancer ([place])    uterine  . Coronary artery disease   . Heart murmur    had forever- not a problem  . Hypertension   . Irregular heart beat   . Kidney stones   . Peripheral vascular disease ([place])   . Varicose veins     Patient Active Problem List   Diagnosis Date Noted  . Varicose veins of lower extremities with other complications [id number]  . Irregular heart beat     Past Surgical History:  Procedure Laterality Date  . ABDOMINAL HYSTERECTOMY    . CATARACT EXTRACTION, BILATERAL    . CHOLECYSTECTOMY    . ENDOVENOUS ABLATION SAPHENOUS VEIN W/ LASER Left 04-21-2013   left greater saphenous vein and sclerotherapy left leg  . EYE SURGERY    . PELVIC EXENTERATION    . TOOTH EXTRACTION N/A 01/15/2018   Procedure: DENTAL RESTORATION/EXTRACTIONS;  Surgeon: Diona Browner, DDS;  Location: [facility];  Service: Oral Surgery;  Laterality: N/A;     OB History   No obstetric history on file.     Family History  Problem Relation Age of Onset  . Hyperlipidemia Mother   . Hypertension Mother   . Other Mother        varicose veins  .  Diabetes Father   . Hyperlipidemia Father   . Hypertension Father   . Peripheral vascular disease Father   . Diabetes Sister   . Hyperlipidemia Sister   . Hypertension Sister   . Other Sister        varicose veins  . Hyperlipidemia Brother   . Hypertension Brother   . Peripheral vascular disease Brother   . Heart disease Daughter        before age 38    Social History   Tobacco Use  . Smoking status: Former Smoker    Types: Cigarettes    Quit date: 03/31/1969    Years since quitting: 50.2  . Smokeless tobacco: Former Network engineer Use Topics  . Alcohol use: Yes  . Drug use: No    Home Medications Prior to Admission medications   Medication Sig Start Date End Date Taking? Authorizing Provider  amoxicillin (AMOXIL) 500 MG capsule Take 1 capsule (500 mg total) by mouth 3 (three) times daily. 01/15/18   Diona Browner, DDS  cholecalciferol (VITAMIN D) 1000 units tablet Take 1,000 Units by mouth daily.    [provider]  diphenhydrAMINE-zinc acetate (BENADRYL EXTRA STRENGTH) cream Apply 1 application topically 3 (three) times daily as needed for itching. 03/26/18   Couture, Cortni S, PA-C  ferrous sulfate 325 (65 FE) MG tablet Take 1 tablet by mouth daily.    [provider]  furosemide (LASIX) 20 MG tablet Take 1 tablet (20 mg total) by mouth daily. Patient not taking: Reported on 01/11/2018 03/20/16   Tanna Furry, MD  HYDROcodone-acetaminophen (NORCO/VICODIN) 5-325 MG tablet Take 1 tablet by mouth 4 (four) times daily as needed for moderate pain.    [provider]  lisinopril-hydrochlorothiazide (PRINZIDE,ZESTORETIC) 10-12.5 MG tablet Take 1 tablet by mouth daily.    [provider]  Multiple Vitamin (MULITIVITAMIN WITH MINERALS) TABS Take 1 tablet by mouth daily.    [provider]  omeprazole (PRILOSEC) 20 MG capsule Take 20 mg by mouth daily.    [provider]  oxyCODONE-acetaminophen (PERCOCET) 5-325 MG tablet Take 1  tablet by mouth every 4 (four) hours as needed. 01/15/18   Diona Browner, DDS  predniSONE (STERAPRED UNI-PAK 21 TAB) 10 MG (21) TBPK tablet Take by mouth daily. Take 6 tabs by mouth daily  for 2 days, then 5 tabs for 2 days, then 4 tabs for 2 days, then 3 tabs for 2 days, 2 tabs for 2 days, then 1 tab by mouth daily for 2 days 03/26/18   Couture, Cortni S, PA-C  pregabalin (LYRICA) 75 MG capsule Take 75 mg by mouth 3 (three) times daily as needed.     [provider]    Allergies    Clonazepam and Tramadol  Review of Systems   Review of Systems  Constitutional: Negative for chills and fever.  HENT: Negative for nosebleeds.   Eyes: Negative for redness.  Respiratory: Negative for shortness of breath.   Cardiovascular: Negative for chest pain.  Gastrointestinal: Negative for abdominal pain and blood in stool.  Genitourinary: Positive for hematuria. Negative for flank pain and vaginal bleeding.  Musculoskeletal: Negative for back pain.  Skin: Negative for rash.  Neurological: Negative for headaches.  Hematological: Does not bruise/bleed easily.  Psychiatric/Behavioral: Negative for confusion.    Physical Exam Updated Vital Signs BP (!) 143/60 (BP Location: Left Arm)   Pulse 63   Temp 98.4 F (36.9 C) (Oral)   Resp 17   Ht 1.549 m (5\' 1" )   Wt 68 kg   SpO2 97%   BMI 28.34 kg/m   Physical Exam Vitals and nursing note reviewed.  Constitutional:      Appearance: Normal appearance. She is well-developed.  HENT:     Head: Atraumatic.     Nose: Nose normal.     Mouth/Throat:     Mouth: Mucous membranes are moist.  Eyes:     General: No scleral icterus.    Conjunctiva/sclera: Conjunctivae normal.  Neck:     Trachea: No tracheal deviation.  Cardiovascular:     Rate and Rhythm: Normal rate and regular rhythm.     Pulses: Normal pulses.     Heart sounds: Normal heart sounds. No murmur. No friction rub. No gallop.   Pulmonary:     Effort: Pulmonary effort is normal.  No respiratory distress.     Breath sounds: Normal breath sounds.  Abdominal:     General: Bowel sounds are normal. There is no distension.     Palpations: Abdomen is soft. There is no mass.     Tenderness: There is no abdominal tenderness. There is no guarding.  Genitourinary:    Comments: No cva tenderness.  No gross blood on vaginal exam. Rectal exam: medium brown stool, no blood.  Musculoskeletal:  General: No swelling.     Cervical back: Normal range of motion and neck supple. No rigidity. No muscular tenderness.  Skin:    General: Skin is warm and dry.     Findings: No rash.  Neurological:     Mental Status: She is alert.     Comments: Alert, speech normal.   Psychiatric:        Mood and Affect: Mood normal.      ED Results / Procedures / Treatments   Labs (all labs ordered are listed, but only abnormal results are displayed) Results for orders placed or performed during the hospital encounter of 07/04/19  Comprehensive metabolic panel  Result Value Ref Range   Sodium 132 (L) 135 - 145 mmol/L   Potassium 4.5 3.5 - 5.1 mmol/L   Chloride 98 98 - 111 mmol/L   CO2 25 22 - 32 mmol/L   Glucose, Bld 101 (H) 70 - 99 mg/dL   BUN 11 8 - 23 mg/dL   Creatinine, Ser 0.66 0.44 - 1.00 mg/dL   Calcium 9.7 8.9 - 10.3 mg/dL   Total Protein 6.5 6.5 - 8.1 g/dL   Albumin 4.1 3.5 - 5.0 g/dL   AST 22 15 - 41 U/L   ALT 15 0 - 44 U/L   Alkaline Phosphatase 32 (L) 38 - 126 U/L   Total Bilirubin 0.5 0.3 - 1.2 mg/dL   GFR calc non Af Amer >60 >60 mL/min   GFR calc Af Amer >60 >60 mL/min   Anion gap 9 5 - 15  CBC  Result Value Ref Range   WBC 3.6 (L) 4.0 - 10.5 K/uL   RBC 3.50 (L) 3.87 - 5.11 MIL/uL   Hemoglobin 10.4 (L) 12.0 - 15.0 g/dL   HCT 32.0 (L) 36.0 - 46.0 %   MCV 91.4 80.0 - 100.0 fL   MCH 29.7 26.0 - 34.0 pg   MCHC 32.5 30.0 - 36.0 g/dL   RDW 12.7 11.5 - 15.5 %   Platelets 226 150 - 400 K/uL   nRBC 0.0 0.0 - 0.2 %  Urinalysis, Routine w reflex microscopic  Result  Value Ref Range   Color, Urine YELLOW YELLOW   APPearance CLEAR CLEAR   Specific Gravity, Urine 1.005 1.005 - 1.030   pH 8.0 5.0 - 8.0   Glucose, UA NEGATIVE NEGATIVE mg/dL   Hgb urine dipstick LARGE (A) NEGATIVE   Bilirubin Urine NEGATIVE NEGATIVE   Ketones, ur NEGATIVE NEGATIVE mg/dL   Protein, ur NEGATIVE NEGATIVE mg/dL   Nitrite NEGATIVE NEGATIVE   Leukocytes,Ua NEGATIVE NEGATIVE   RBC / HPF >50 (H) 0 - 5 RBC/hpf   WBC, UA 0-5 0 - 5 WBC/hpf   Bacteria, UA RARE (A) NONE SEEN  Type and screen [place]  Result Value Ref Range   ABO/RH(D) A POS    Antibody Screen NEG    Sample Expiration      07/07/2019,2359 Performed at [facility], [address], [place], [place] [address]   ABO/Rh  Result Value Ref Range   ABO/RH(D)      A POS Performed at [facility] [address], [place], [place] [address]    EKG EKG Interpretation  Date/Time:  Monday July 04 2019 12:55:43 EDT Ventricular Rate:  68 PR Interval:  184 QRS Duration: 80 QT Interval:  374 QTC Calculation: 397 R Axis:   55 Text Interpretation: Normal sinus rhythm Nonspecific ST abnormality Confirmed by Lajean Saver [phone]) on  07/04/2019 6:18:21 PM   Radiology No results found.  Procedures Procedures (including critical care time)  Medications Ordered in ED Medications - No data to display  ED Course  I have reviewed the triage vital signs and the nursing notes.  Pertinent labs & imaging results that were available during my care of the patient were reviewed by me and considered in my medical decision making (see chart for details).    MDM Rules/Calculators/A&P                      Labs sent.   Reviewed nursing notes and prior charts for additional history.   A couple months ago, pt with CT abd/pelvis, neg then except possible cystitis. UA then was not particularly remarkable, and no culture was sent.   Initial labs reviewed/interpreted by me - hgb 10, c/w prior.    UA remains pending.   Additional labs reviewed/interpreted by me - ua w > 50 rbc. Will sent culture and tx. Rocephin iv.   Given recent ct, recurrent hematuria, r/o hemorrhagic cystitis, r/o bladder ca - will have f/u urology.   Return precautions provided.   MDM Number of Diagnoses or Management Options   Amount and/or Complexity of Data Reviewed Clinical lab tests: ordered and reviewed Tests in the medicine section of CPT: ordered and reviewed Decide to obtain previous medical records or to obtain history from someone other than the patient: yes Review and summarize past medical records: yes Independent visualization of images, tracings, or specimens: yes     Final Clinical Impression(s) / ED Diagnoses Final diagnoses:  None    Rx / DC Orders ED Discharge Orders    None       Lajean Saver, MD 07/04/19 1916

## 2019-07-04 NOTE — ED Notes (Signed)
Discharge instructions discussed with pt. And family at bedside. Family was able to verbalized understanding with no other questions at this time. Pt to follow up with urology.

## 2019-07-04 NOTE — ED Triage Notes (Signed)
Pt states she started bleeding today but is not sure if it is from her rectum or vagina. Had a colonoscopy a month ago but not sure of the results. Feels weak and nauseous. Not on blood thinners.

## 2019-07-06 LAB — URINE CULTURE: Culture: <10000 — AB

## 2019-08-08 ENCOUNTER — Encounter (HOSPITAL_COMMUNITY): Payer: Self-pay | Admitting: Emergency Medicine

## 2019-08-08 ENCOUNTER — Emergency Department (HOSPITAL_COMMUNITY)
Admission: EM | Admit: 2019-08-08 | Discharge: 2019-08-08 | Disposition: A | Discharge status: Home / Self Care | Payer: Medicare HMO | Attending: Emergency Medicine | Admitting: Emergency Medicine

## 2019-08-08 ENCOUNTER — Other Ambulatory Visit: Payer: Self-pay

## 2019-08-08 ENCOUNTER — Ambulatory Visit (HOSPITAL_COMMUNITY): Admission: RE | Admit: 2019-08-08 | Payer: Medicare HMO | Source: Ambulatory Visit

## 2019-08-08 ENCOUNTER — Emergency Department (HOSPITAL_COMMUNITY): Payer: Medicare HMO

## 2019-08-08 DIAGNOSIS — Z87891 Personal history of nicotine dependence: Secondary | ICD-10-CM | POA: Insufficient documentation

## 2019-08-08 DIAGNOSIS — R609 Edema, unspecified: Secondary | ICD-10-CM | POA: Diagnosis present

## 2019-08-08 DIAGNOSIS — Z79899 Other long term (current) drug therapy: Secondary | ICD-10-CM | POA: Diagnosis not present

## 2019-08-08 DIAGNOSIS — L03119 Cellulitis of unspecified part of limb: Secondary | ICD-10-CM

## 2019-08-08 DIAGNOSIS — I2510 Atherosclerotic heart disease of native coronary artery without angina pectoris: Secondary | ICD-10-CM | POA: Diagnosis not present

## 2019-08-08 DIAGNOSIS — I10 Essential (primary) hypertension: Secondary | ICD-10-CM | POA: Insufficient documentation

## 2019-08-08 DIAGNOSIS — I739 Peripheral vascular disease, unspecified: Secondary | ICD-10-CM | POA: Diagnosis not present

## 2019-08-08 DIAGNOSIS — R600 Localized edema: Secondary | ICD-10-CM | POA: Diagnosis not present

## 2019-08-08 LAB — BASIC METABOLIC PANEL
Anion gap: 8 (ref 5–15)
BUN: 13 mg/dL (ref 8–23)
CO2: 29 mmol/L (ref 22–32)
Calcium: 9.9 mg/dL (ref 8.9–10.3)
Chloride: 97 mmol/L — ABNORMAL LOW (ref 98–111)
Creatinine, Ser: 0.72 mg/dL (ref 0.44–1.00)
GFR calc Af Amer: >60 mL/min (ref >60)
GFR calc non Af Amer: >60 mL/min (ref >60)
Glucose, Bld: 104 mg/dL — ABNORMAL HIGH (ref 70–99)
Potassium: 4.5 mmol/L (ref 3.5–5.1)
Sodium: 134 mmol/L — ABNORMAL LOW (ref 135–145)

## 2019-08-08 LAB — CBC WITH DIFFERENTIAL/PLATELET
Abs Immature Granulocytes: 0.01 10*3/uL (ref 0.00–0.07)
Basophils Absolute: 0 10*3/uL (ref 0.0–0.1)
Basophils Relative: 0 %
Eosinophils Absolute: 0.3 10*3/uL (ref 0.0–0.5)
Eosinophils Relative: 5 %
HCT: 30.6 % — ABNORMAL LOW (ref 36.0–46.0)
Hemoglobin: 10 g/dL — ABNORMAL LOW (ref 12.0–15.0)
Immature Granulocytes: 0 %
Lymphocytes Relative: 29 %
Lymphs Abs: 1.5 10*3/uL (ref 0.7–4.0)
MCH: 29.4 pg (ref 26.0–34.0)
MCHC: 32.7 g/dL (ref 30.0–36.0)
MCV: 90 fL (ref 80.0–100.0)
Monocytes Absolute: 0.5 10*3/uL (ref 0.1–1.0)
Monocytes Relative: 9 %
Neutro Abs: 3 10*3/uL (ref 1.7–7.7)
Neutrophils Relative %: 57 %
Platelets: 222 10*3/uL (ref 150–400)
RBC: 3.4 MIL/uL — ABNORMAL LOW (ref 3.87–5.11)
RDW: 12.7 % (ref 11.5–15.5)
WBC: 5.3 10*3/uL (ref 4.0–10.5)
nRBC: 0 % (ref 0.0–0.2)

## 2019-08-08 LAB — BRAIN NATRIURETIC PEPTIDE: B Natriuretic Peptide: 52.2 pg/mL (ref 0.0–100.0)

## 2019-08-08 MED ORDER — DOXYCYCLINE HYCLATE 100 MG PO TABS
100.0000 mg | ORAL_TABLET | Freq: Once | ORAL | Status: AC
  Administered 2019-08-08: 100 mg via ORAL
  Filled 2019-08-08: qty 1

## 2019-08-08 MED ORDER — FUROSEMIDE 20 MG PO TABS
40.0000 mg | ORAL_TABLET | Freq: Once | ORAL | Status: AC
  Administered 2019-08-08: 40 mg via ORAL
  Filled 2019-08-08: qty 2

## 2019-08-08 MED ORDER — DOXYCYCLINE HYCLATE 100 MG PO CAPS: 100.0000 mg | ORAL_CAPSULE | Freq: Two times a day (BID) | ORAL | 0 refills | Status: DC

## 2019-08-08 MED ORDER — FUROSEMIDE 20 MG PO TABS: 20.0000 mg | ORAL_TABLET | Freq: Every day | ORAL | 0 refills | Status: DC

## 2019-08-08 NOTE — ED Notes (Signed)
Pt ambulatory to and from BR with no assistance and steady gait.

## 2019-08-08 NOTE — ED Triage Notes (Signed)
Pt brought to ED by [facility] from home for increase swollen and pain on bilateral legs, pt denies any injury. BP 160/90, HR 76, R 16, SPO2 98%  RA

## 2019-08-08 NOTE — Discharge Instructions (Addendum)
1. Medications: doxycycline, lasix, usual home medications 2. Treatment: rest, drink plenty of fluids,  3. Follow Up: Please followup with your primary doctor in 2-3 days for repeat evaluation.  Please also return tomorrow for lower extremity venous duplex as indicated on your discharge paperwork.  Please return to the ER for fevers, chills, chest pain, shortness of breath or other concerns.

## 2019-08-08 NOTE — ED Provider Notes (Signed)
[facility] EMERGENCY DEPARTMENT Provider Note   CSN: [id number] Arrival date & time: 08/08/19  0025     History Chief Complaint  Patient presents with  . Leg Pain    Haley Long is a 69 y.o. female with a hx of uterine cancer, coronary artery disease, hypertension, peripheral vascular disease, varicose veins presents to the Emergency Department complaining of gradual, persistent, progressively worsening leg swelling onset 3 days ago.  Patient reports she also noticed associated redness and increased warmth in her legs.  She reports they have become painful.  She states she is able to walk without difficulty.  No chest pain or shortness of breath.  Patient denies fever, chills, headache, neck pain, chest pain, shortness of breath, abdominal pain, nausea, vomiting, diarrhea, weakness, dizziness, syncope.  Patient reports palpation of her legs makes the pain worse.  Nothing particularly makes them better.  She reports she has been taking her blood pressure/diuretic pill as directed.  The history is provided by the patient and medical records. No language interpreter was used.       Past Medical History:  Diagnosis Date  . Arthritis   . Cancer ([place])    uterine  . Coronary artery disease   . Heart murmur    had forever- not a problem  . Hypertension   . Irregular heart beat   . Kidney stones   . Peripheral vascular disease ([place])   . Varicose veins     Patient Active Problem List   Diagnosis Date Noted  . Varicose veins of lower extremities with other complications [id number]  . Irregular heart beat     Past Surgical History:  Procedure Laterality Date  . ABDOMINAL HYSTERECTOMY    . CATARACT EXTRACTION, BILATERAL    . CHOLECYSTECTOMY    . ENDOVENOUS ABLATION SAPHENOUS VEIN W/ LASER Left 04-21-2013   left greater saphenous vein and sclerotherapy left leg  . EYE SURGERY    . PELVIC EXENTERATION    . TOOTH EXTRACTION N/A 01/15/2018   Procedure: DENTAL  RESTORATION/EXTRACTIONS;  Surgeon: Diona Browner, DDS;  Location: [facility];  Service: Oral Surgery;  Laterality: N/A;     OB History   No obstetric history on file.     Family History  Problem Relation Age of Onset  . Hyperlipidemia Mother   . Hypertension Mother   . Other Mother        varicose veins  . Diabetes Father   . Hyperlipidemia Father   . Hypertension Father   . Peripheral vascular disease Father   . Diabetes Sister   . Hyperlipidemia Sister   . Hypertension Sister   . Other Sister        varicose veins  . Hyperlipidemia Brother   . Hypertension Brother   . Peripheral vascular disease Brother   . Heart disease Daughter        before age 70    Social History   Tobacco Use  . Smoking status: Former Smoker    Types: Cigarettes    Quit date: 03/31/1969    Years since quitting: 50.3  . Smokeless tobacco: Former Network engineer Use Topics  . Alcohol use: Yes  . Drug use: No    Home Medications Prior to Admission medications   Medication Sig Start Date End Date Taking? Authorizing Provider  cephALEXin (KEFLEX) 500 MG capsule Take 1 capsule (500 mg total) by mouth 4 (four) times daily. 07/04/19   Lajean Saver, MD  cholecalciferol (VITAMIN D)  1000 units tablet Take 1,000 Units by mouth daily.    [provider]  diphenhydrAMINE-zinc acetate (BENADRYL EXTRA STRENGTH) cream Apply 1 application topically 3 (three) times daily as needed for itching. 03/26/18   Couture, Cortni S, PA-C  doxycycline (VIBRAMYCIN) 100 MG capsule Take 1 capsule (100 mg total) by mouth 2 (two) times daily. 08/08/19   Haivyn Oravec, Jarrett Soho, PA-C  ferrous sulfate 325 (65 FE) MG tablet Take 1 tablet by mouth daily.    [provider]  furosemide (LASIX) 20 MG tablet Take 1 tablet (20 mg total) by mouth daily for 3 days. 08/08/19 08/11/19  Vishaal Strollo, Jarrett Soho, PA-C  HYDROcodone-acetaminophen (NORCO/VICODIN) 5-325 MG tablet Take 1 tablet by mouth 4 (four) times daily as needed for  moderate pain.    [provider]  lisinopril-hydrochlorothiazide (PRINZIDE,ZESTORETIC) 10-12.5 MG tablet Take 1 tablet by mouth daily.    [provider]  Multiple Vitamin (MULITIVITAMIN WITH MINERALS) TABS Take 1 tablet by mouth daily.    [provider]  omeprazole (PRILOSEC) 20 MG capsule Take 20 mg by mouth daily.    [provider]  oxyCODONE-acetaminophen (PERCOCET) 5-325 MG tablet Take 1 tablet by mouth every 4 (four) hours as needed. 01/15/18   Diona Browner, DDS  predniSONE (STERAPRED UNI-PAK 21 TAB) 10 MG (21) TBPK tablet Take by mouth daily. Take 6 tabs by mouth daily  for 2 days, then 5 tabs for 2 days, then 4 tabs for 2 days, then 3 tabs for 2 days, 2 tabs for 2 days, then 1 tab by mouth daily for 2 days 03/26/18   Couture, Cortni S, PA-C  pregabalin (LYRICA) 75 MG capsule Take 75 mg by mouth 3 (three) times daily as needed.     [provider]    Allergies    Clonazepam and Tramadol  Review of Systems   Review of Systems  Constitutional: Negative for appetite change, diaphoresis, fatigue, fever and unexpected weight change.  HENT: Negative for mouth sores.   Eyes: Negative for visual disturbance.  Respiratory: Negative for cough, chest tightness, shortness of breath and wheezing.   Cardiovascular: Positive for leg swelling. Negative for chest pain.  Gastrointestinal: Negative for abdominal pain, constipation, diarrhea, nausea and vomiting.  Endocrine: Negative for polydipsia, polyphagia and polyuria.  Genitourinary: Negative for dysuria, frequency, hematuria and urgency.  Musculoskeletal: Negative for back pain and neck stiffness.  Skin: Positive for color change. Negative for rash.  Allergic/Immunologic: Negative for immunocompromised state.  Neurological: Negative for syncope, light-headedness and headaches.  Hematological: Does not bruise/bleed easily.  Psychiatric/Behavioral: Negative for sleep disturbance. The patient is not  nervous/anxious.     Physical Exam Updated Vital Signs BP (!) 180/64 (BP Location: Right Arm)   Pulse 61   Temp 98.3 F (36.8 C) (Oral)   Resp 16   Ht 5\' 1"  (1.549 m)   Wt 68 kg   SpO2 100%   BMI 28.33 kg/m   Physical Exam Vitals and nursing note reviewed.  Constitutional:      General: She is not in acute distress.    Appearance: She is not diaphoretic.  HENT:     Head: Normocephalic.  Eyes:     General: No scleral icterus.    Conjunctiva/sclera: Conjunctivae normal.  Cardiovascular:     Rate and Rhythm: Normal rate and regular rhythm.     Pulses: Normal pulses.          Radial pulses are 2+ on the right side and 2+ on the left side.  Pulmonary:     Effort: Pulmonary effort is normal. No tachypnea, accessory muscle usage, prolonged expiration, respiratory distress or retractions.     Breath sounds: Normal breath sounds. No stridor.     Comments: Equal chest rise. No increased work of breathing. Abdominal:     General: There is no distension.     Palpations: Abdomen is soft.     Tenderness: There is no abdominal tenderness. There is no guarding or rebound.  Musculoskeletal:     Cervical back: Normal range of motion.     Right lower leg: Edema present.     Left lower leg: Edema present.     Comments: Moves all extremities equally and without difficulty. Bilateral lower extremity edema with 2+ pitting, very mild erythema and increased warmth.  No induration, open wounds or oozing.  Skin:    General: Skin is warm and dry.     Capillary Refill: Capillary refill takes less than 2 seconds.  Neurological:     Mental Status: She is alert.     GCS: GCS eye subscore is 4. GCS verbal subscore is 5. GCS motor subscore is 6.     Comments: Speech is clear and goal oriented.  Psychiatric:        Mood and Affect: Mood normal.     ED Results / Procedures / Treatments   Labs (all labs ordered are listed, but only abnormal results are displayed) Labs Reviewed  CBC WITH  DIFFERENTIAL/PLATELET - Abnormal; Notable for the following components:      Result Value   RBC 3.40 (*)    Hemoglobin 10.0 (*)    HCT 30.6 (*)    All other components within normal limits  BASIC METABOLIC PANEL - Abnormal; Notable for the following components:   Sodium 134 (*)    Chloride 97 (*)    Glucose, Bld 104 (*)    All other components within normal limits  BRAIN NATRIURETIC PEPTIDE  URINALYSIS, ROUTINE W REFLEX MICROSCOPIC    EKG EKG Interpretation  Date/Time:  Monday Aug 08 2019 04:03:37 EDT Ventricular Rate:  57 PR Interval:    QRS Duration: 119 QT Interval:  412 QTC Calculation: 402 R Axis:   28 Text Interpretation: Sinus rhythm Borderline prolonged PR interval Nonspecific intraventricular conduction delay No significant change was found Confirmed by Ezequiel Essex [phone]) on 08/08/2019 4:07:55 AM   Radiology DG Chest Port 1 View  Result Date: 08/08/2019 CLINICAL DATA:  Lower extremity edema EXAM: PORTABLE CHEST 1 VIEW COMPARISON:  06/07/2016 FINDINGS: The heart size and mediastinal contours are within normal limits. Both lungs are clear. The visualized skeletal structures are unremarkable. IMPRESSION: No active disease. Electronically Signed   By: Randa Ngo M.D.   On: 08/08/2019 03:46    Procedures Procedures (including critical care time)  Medications Ordered in ED Medications  furosemide (LASIX) tablet 40 mg (40 mg Oral Given 08/08/19 0524)  doxycycline (VIBRA-TABS) tablet 100 mg (100 mg Oral Given 08/08/19 0524)    ED Course  I have reviewed the triage vital signs and the nursing notes.  Pertinent labs & imaging results that were available during my care of the patient were reviewed by me and considered in my medical decision making (see chart for details).  Clinical Course as of Aug 07 540  Mon Aug 08, 2019  0431 baseline  Hemoglobin(!): 10.0 [HM]    Clinical Course User Index [HM] Shambhavi Salley, Gwenlyn Perking   MDM Rules/Calculators/A&P  Presents with peripheral edema.  She has a history of same and takes lisinopril-hydrochlorothiazide at home.  She has no documented history of CHF and denies this on my evaluation.  3 days of worsening peripheral edema with increased warmth and pain.  She does have mild redness however no induration.  Concern for possible early cellulitis.  Will begin doxycycline.  Additionally will give lasix and schedule for outpatient Dopplers in the morning.  The patient was discussed with and seen by Dr. Wyvonnia Dusky who agrees with the treatment plan.  Final Clinical Impression(s) / ED Diagnoses Final diagnoses:  Peripheral edema  Cellulitis of lower extremity, unspecified laterality    Rx / DC Orders ED Discharge Orders         Ordered    furosemide (LASIX) 20 MG tablet  Daily     08/08/19 0539    doxycycline (VIBRAMYCIN) 100 MG capsule  2 times daily     08/08/19 0539    VAS [place] LOWER EXTREMITY VENOUS (DVT)     08/08/19 0539           Darlinda Bellows, Jarrett Soho, PA-C 08/08/19 0542    Ezequiel Essex, MD 08/08/19 0930

## 2020-01-24 ENCOUNTER — Emergency Department (HOSPITAL_COMMUNITY)
Admission: EM | Admit: 2020-01-24 | Discharge: 2020-01-24 | Disposition: A | Discharge status: Home / Self Care | Payer: Medicare HMO | Attending: Emergency Medicine | Admitting: Emergency Medicine

## 2020-01-24 DIAGNOSIS — I2510 Atherosclerotic heart disease of native coronary artery without angina pectoris: Secondary | ICD-10-CM | POA: Insufficient documentation

## 2020-01-24 DIAGNOSIS — Z8542 Personal history of malignant neoplasm of other parts of uterus: Secondary | ICD-10-CM | POA: Diagnosis not present

## 2020-01-24 DIAGNOSIS — R319 Hematuria, unspecified: Secondary | ICD-10-CM | POA: Insufficient documentation

## 2020-01-24 DIAGNOSIS — Z87891 Personal history of nicotine dependence: Secondary | ICD-10-CM | POA: Insufficient documentation

## 2020-01-24 DIAGNOSIS — I119 Hypertensive heart disease without heart failure: Secondary | ICD-10-CM | POA: Diagnosis not present

## 2020-01-24 DIAGNOSIS — Z79899 Other long term (current) drug therapy: Secondary | ICD-10-CM | POA: Insufficient documentation

## 2020-01-24 DIAGNOSIS — R600 Localized edema: Secondary | ICD-10-CM | POA: Insufficient documentation

## 2020-01-24 DIAGNOSIS — R609 Edema, unspecified: Secondary | ICD-10-CM

## 2020-01-24 LAB — COMPREHENSIVE METABOLIC PANEL
ALT: 15 U/L (ref 0–44)
AST: 21 U/L (ref 15–41)
Albumin: 3.9 g/dL (ref 3.5–5.0)
Alkaline Phosphatase: 33 U/L — ABNORMAL LOW (ref 38–126)
Anion gap: 8 (ref 5–15)
BUN: 14 mg/dL (ref 8–23)
CO2: 27 mmol/L (ref 22–32)
Calcium: 9.5 mg/dL (ref 8.9–10.3)
Chloride: 98 mmol/L (ref 98–111)
Creatinine, Ser: 0.92 mg/dL (ref 0.44–1.00)
GFR, Estimated: >60 mL/min (ref >60)
Glucose, Bld: 92 mg/dL (ref 70–99)
Potassium: 4.2 mmol/L (ref 3.5–5.1)
Sodium: 133 mmol/L — ABNORMAL LOW (ref 135–145)
Total Bilirubin: 0.6 mg/dL (ref 0.3–1.2)
Total Protein: 5.9 g/dL — ABNORMAL LOW (ref 6.5–8.1)

## 2020-01-24 LAB — URINALYSIS, ROUTINE W REFLEX MICROSCOPIC
Bilirubin Urine: NEGATIVE
Glucose, UA: NEGATIVE mg/dL
Hgb urine dipstick: NEGATIVE
Ketones, ur: NEGATIVE mg/dL
Leukocytes,Ua: NEGATIVE
Nitrite: NEGATIVE
Protein, ur: NEGATIVE mg/dL
Specific Gravity, Urine: 1.003 — ABNORMAL LOW (ref 1.005–1.030)
pH: 7 (ref 5.0–8.0)

## 2020-01-24 LAB — CBC WITH DIFFERENTIAL/PLATELET
Abs Immature Granulocytes: 0 10*3/uL (ref 0.00–0.07)
Basophils Absolute: 0 10*3/uL (ref 0.0–0.1)
Basophils Relative: 1 %
Eosinophils Absolute: 0.3 10*3/uL (ref 0.0–0.5)
Eosinophils Relative: 6 %
HCT: 31.2 % — ABNORMAL LOW (ref 36.0–46.0)
Hemoglobin: 10.1 g/dL — ABNORMAL LOW (ref 12.0–15.0)
Immature Granulocytes: 0 %
Lymphocytes Relative: 40 %
Lymphs Abs: 1.6 10*3/uL (ref 0.7–4.0)
MCH: 29.9 pg (ref 26.0–34.0)
MCHC: 32.4 g/dL (ref 30.0–36.0)
MCV: 92.3 fL (ref 80.0–100.0)
Monocytes Absolute: 0.4 10*3/uL (ref 0.1–1.0)
Monocytes Relative: 10 %
Neutro Abs: 1.7 10*3/uL (ref 1.7–7.7)
Neutrophils Relative %: 43 %
Platelets: 213 10*3/uL (ref 150–400)
RBC: 3.38 MIL/uL — ABNORMAL LOW (ref 3.87–5.11)
RDW: 12.2 % (ref 11.5–15.5)
WBC: 4 10*3/uL (ref 4.0–10.5)
nRBC: 0 % (ref 0.0–0.2)

## 2020-01-24 LAB — BRAIN NATRIURETIC PEPTIDE: B Natriuretic Peptide: 45.2 pg/mL (ref 0.0–100.0)

## 2020-01-24 MED ORDER — FUROSEMIDE 20 MG PO TABS: 20.0000 mg | ORAL_TABLET | Freq: Every day | ORAL | 0 refills | Status: AC

## 2020-01-24 NOTE — ED Provider Notes (Signed)
[facility] EMERGENCY DEPARTMENT Provider Note   CSN: [id number] Arrival date & time: 01/24/20  4709     History Chief Complaint  Patient presents with  . Abdominal Pain  . Leg Swelling    Haley Long is a 69 y.o. female.  HPI     Presenting with hematuria which began today and leg swelling and pain for about 4 days   Both legs with swelling and pain takes lisinopril-hctz every day, no recent changes, thinks takes lasix BID as well No chest pain or dyspnea, no weight gain  Blood in urine started today, some dysuria, some strings of blood No n/v/fevers/chills Lower back pain   Was sent to kidney specialist/urologist in [place] for prior hematuria  Hx of hysterectomy, cancer, ovaries  Past Medical History:  Diagnosis Date  . Arthritis   . Cancer ([place])    uterine  . Coronary artery disease   . Heart murmur    had forever- not a problem  . Hypertension   . Irregular heart beat   . Kidney stones   . Peripheral vascular disease ([place])   . Varicose veins     Patient Active Problem List   Diagnosis Date Noted  . Varicose veins of lower extremities with other complications 62/83/6629  . Irregular heart beat     Past Surgical History:  Procedure Laterality Date  . ABDOMINAL HYSTERECTOMY    . CATARACT EXTRACTION, BILATERAL    . CHOLECYSTECTOMY    . ENDOVENOUS ABLATION SAPHENOUS VEIN W/ LASER Left 04-21-2013   left greater saphenous vein and sclerotherapy left leg  . EYE SURGERY    . PELVIC EXENTERATION    . TOOTH EXTRACTION N/A 01/15/2018   Procedure: DENTAL RESTORATION/EXTRACTIONS;  Surgeon: Diona Browner, DDS;  Location: [facility];  Service: Oral Surgery;  Laterality: N/A;     OB History   No obstetric history on file.     Family History  Problem Relation Age of Onset  . Hyperlipidemia Mother   . Hypertension Mother   . Other Mother        varicose veins  . Diabetes Father   . Hyperlipidemia Father   . Hypertension Father   .  Peripheral vascular disease Father   . Diabetes Sister   . Hyperlipidemia Sister   . Hypertension Sister   . Other Sister        varicose veins  . Hyperlipidemia Brother   . Hypertension Brother   . Peripheral vascular disease Brother   . Heart disease Daughter        before age 27    Social History   Tobacco Use  . Smoking status: Former Smoker    Types: Cigarettes    Quit date: 03/31/1969    Years since quitting: 50.8  . Smokeless tobacco: Former Network engineer Use Topics  . Alcohol use: Yes  . Drug use: No    Home Medications Prior to Admission medications   Medication Sig Start Date End Date Taking? Authorizing Provider  cephALEXin (KEFLEX) 500 MG capsule Take 1 capsule (500 mg total) by mouth 4 (four) times daily. 07/04/19   Lajean Saver, MD  cholecalciferol (VITAMIN D) 1000 units tablet Take 1,000 Units by mouth daily.    [provider]  diphenhydrAMINE-zinc acetate (BENADRYL EXTRA STRENGTH) cream Apply 1 application topically 3 (three) times daily as needed for itching. 03/26/18   Couture, Cortni S, PA-C  doxycycline (VIBRAMYCIN) 100 MG capsule Take 1 capsule (100 mg total) by  mouth 2 (two) times daily. 08/08/19   Muthersbaugh, Jarrett Soho, PA-C  ferrous sulfate 325 (65 FE) MG tablet Take 1 tablet by mouth daily.    [provider]  furosemide (LASIX) 20 MG tablet Take 1 tablet (20 mg total) by mouth daily for 3 days. 01/24/20 01/27/20  Gareth Morgan, MD  HYDROcodone-acetaminophen (NORCO/VICODIN) 5-325 MG tablet Take 1 tablet by mouth 4 (four) times daily as needed for moderate pain.    [provider]  lisinopril-hydrochlorothiazide (PRINZIDE,ZESTORETIC) 10-12.5 MG tablet Take 1 tablet by mouth daily.    [provider]  Multiple Vitamin (MULITIVITAMIN WITH MINERALS) TABS Take 1 tablet by mouth daily.    [provider]  omeprazole (PRILOSEC) 20 MG capsule Take 20 mg by mouth daily.    [provider]    oxyCODONE-acetaminophen (PERCOCET) 5-325 MG tablet Take 1 tablet by mouth every 4 (four) hours as needed. 01/15/18   Diona Browner, DDS  predniSONE (STERAPRED UNI-PAK 21 TAB) 10 MG (21) TBPK tablet Take by mouth daily. Take 6 tabs by mouth daily  for 2 days, then 5 tabs for 2 days, then 4 tabs for 2 days, then 3 tabs for 2 days, 2 tabs for 2 days, then 1 tab by mouth daily for 2 days 03/26/18   Couture, Cortni S, PA-C  pregabalin (LYRICA) 75 MG capsule Take 75 mg by mouth 3 (three) times daily as needed.     [provider]    Allergies    Clonazepam and Tramadol  Review of Systems   Review of Systems  Constitutional: Negative for fever.  HENT: Negative for sore throat.   Eyes: Negative for visual disturbance.  Respiratory: Negative for cough and shortness of breath.   Cardiovascular: Positive for leg swelling. Negative for chest pain.  Gastrointestinal: Positive for abdominal pain. Negative for constipation, diarrhea, nausea and vomiting.  Genitourinary: Positive for dysuria and hematuria. Negative for difficulty urinating.  Musculoskeletal: Positive for back pain. Negative for neck pain.  Skin: Negative for rash.  Neurological: Negative for syncope and headaches.    Physical Exam Updated Vital Signs BP (!) 127/53   Pulse (!) 58   Temp 98.3 F (36.8 C) (Oral)   Resp 13   SpO2 96%   Physical Exam Vitals and nursing note reviewed.  Constitutional:      General: She is not in acute distress.    Appearance: She is well-developed. She is not diaphoretic.  HENT:     Head: Normocephalic and atraumatic.  Eyes:     Conjunctiva/sclera: Conjunctivae normal.  Cardiovascular:     Rate and Rhythm: Normal rate and regular rhythm.     Heart sounds: Normal heart sounds. No murmur heard.  No friction rub. No gallop.   Pulmonary:     Effort: Pulmonary effort is normal. No respiratory distress.     Breath sounds: Normal breath sounds. No wheezing or rales.  Abdominal:      General: There is no distension.     Palpations: Abdomen is soft.     Tenderness: There is abdominal tenderness (mild suprapubic) in the suprapubic area. There is no guarding.  Musculoskeletal:        General: No tenderness.     Cervical back: Normal range of motion.     Right lower leg: Edema present.     Left lower leg: Edema present.  Skin:    General: Skin is warm and dry.     Findings: No erythema or rash.  Neurological:  Mental Status: She is alert and oriented to person, place, and time.     ED Results / Procedures / Treatments   Labs (all labs ordered are listed, but only abnormal results are displayed) Labs Reviewed  CBC WITH DIFFERENTIAL/PLATELET - Abnormal; Notable for the following components:      Result Value   RBC 3.38 (*)    Hemoglobin 10.1 (*)    HCT 31.2 (*)    All other components within normal limits  COMPREHENSIVE METABOLIC PANEL - Abnormal; Notable for the following components:   Sodium 133 (*)    Total Protein 5.9 (*)    Alkaline Phosphatase 33 (*)    All other components within normal limits  URINALYSIS, ROUTINE W REFLEX MICROSCOPIC - Abnormal; Notable for the following components:   Color, Urine STRAW (*)    Specific Gravity, Urine 1.003 (*)    All other components within normal limits  URINE CULTURE  BRAIN NATRIURETIC PEPTIDE    EKG EKG Interpretation  Date/Time:  Tuesday January 24 2020 17:39:41 EDT Ventricular Rate:  61 PR Interval:    QRS Duration: 104 QT Interval:  390 QTC Calculation: 393 R Axis:   39 Text Interpretation: Sinus rhythm Prolonged PR interval No significant change since last tracing Confirmed by Gareth Morgan [phone]) on 01/24/2020 6:32:55 PM Also confirmed by Gareth Morgan [phone]), editor Victory Dakin [phone])  on 01/25/2020 7:48:53 AM   Radiology No results found.  Procedures Procedures (including critical care time)  Medications Ordered in ED Medications - No data to display  ED Course  I have  reviewed the triage vital signs and the nursing notes.  Pertinent labs & imaging results that were available during my care of the patient were reviewed by me and considered in my medical decision making (see chart for details).    MDM Rules/Calculators/A&P                          69yo female with history above presents with concern for days of recurrent lower extremity edema and hematuria beginning today.  Has had prior US of legs without DVT and has no asymmetry today. Normal pulses, no sign of acute arterial occlusion. Skin changes of venous stasis without signs of cellulitis or abscess. For legs, given increased 20mg  lasix for 3 days and recommend PCP follow up. No symptoms to suggest acute CHF exacerbation.   Pt clear she is having hematuria and not rectal or vaginal bleeding (also does have hx of hysterectomy). Urine at this timea appears clear and UA without signs of blood or infection. Will send for culture given symptoms.  Initially discussed obtaining CT given lower abdominal pain, but she reports feeling better and in this setting have low suspcion for appendicitis, diverticulitis, mesenteric ischemia.  Recommend strict return precauations and PCP Follow up.   Final Clinical Impression(s) / ED Diagnoses Final diagnoses:  Peripheral edema  Hematuria, unspecified type    Rx / DC Orders ED Discharge Orders         Ordered    furosemide (LASIX) 20 MG tablet  Daily        01/24/20 2139           Gareth Morgan, MD 01/25/20 1144

## 2020-01-24 NOTE — ED Triage Notes (Signed)
To triage via EMS from home.  Onset 3 days increased pedal edema, abd pain that is radiating to back, and hematuria.l   EMS BP 162/74 HR 78 SpO2 99% RR 16

## 2020-01-26 LAB — URINE CULTURE: Culture: <10000 — AB

## 2020-07-03 ENCOUNTER — Other Ambulatory Visit: Payer: Self-pay | Admitting: *Deleted

## 2020-07-03 ENCOUNTER — Other Ambulatory Visit: Payer: Self-pay | Admitting: Internal Medicine

## 2020-07-03 DIAGNOSIS — Z1231 Encounter for screening mammogram for malignant neoplasm of breast: Secondary | ICD-10-CM

## 2020-08-23 ENCOUNTER — Other Ambulatory Visit: Payer: Self-pay

## 2020-08-23 ENCOUNTER — Ambulatory Visit
Admission: RE | Admit: 2020-08-23 | Discharge: 2020-08-23 | Disposition: A | Discharge status: Home / Self Care | Payer: Medicare HMO | Source: Ambulatory Visit | Attending: Internal Medicine | Admitting: Internal Medicine

## 2020-08-23 DIAGNOSIS — Z1231 Encounter for screening mammogram for malignant neoplasm of breast: Secondary | ICD-10-CM

## 2021-07-09 ENCOUNTER — Emergency Department (HOSPITAL_COMMUNITY): Payer: Medicare HMO

## 2021-07-09 ENCOUNTER — Other Ambulatory Visit: Payer: Self-pay

## 2021-07-09 ENCOUNTER — Emergency Department (HOSPITAL_COMMUNITY)
Admission: EM | Admit: 2021-07-09 | Discharge: 2021-07-09 | Discharge status: Against Medical Advice | Payer: Medicare HMO | Attending: Physician Assistant | Admitting: Physician Assistant

## 2021-07-09 ENCOUNTER — Encounter (HOSPITAL_COMMUNITY): Payer: Self-pay

## 2021-07-09 DIAGNOSIS — R600 Localized edema: Secondary | ICD-10-CM | POA: Diagnosis not present

## 2021-07-09 DIAGNOSIS — Z5321 Procedure and treatment not carried out due to patient leaving prior to being seen by health care provider: Secondary | ICD-10-CM | POA: Diagnosis not present

## 2021-07-09 DIAGNOSIS — M79604 Pain in right leg: Secondary | ICD-10-CM | POA: Diagnosis present

## 2021-07-09 DIAGNOSIS — M79605 Pain in left leg: Secondary | ICD-10-CM | POA: Insufficient documentation

## 2021-07-09 LAB — CBC WITH DIFFERENTIAL/PLATELET
Abs Immature Granulocytes: 0.01 10*3/uL (ref 0.00–0.07)
Basophils Absolute: 0 10*3/uL (ref 0.0–0.1)
Basophils Relative: 1 %
Eosinophils Absolute: 0.2 10*3/uL (ref 0.0–0.5)
Eosinophils Relative: 4 %
HCT: 32.9 % — ABNORMAL LOW (ref 36.0–46.0)
Hemoglobin: 10.6 g/dL — ABNORMAL LOW (ref 12.0–15.0)
Immature Granulocytes: 0 %
Lymphocytes Relative: 30 %
Lymphs Abs: 1.4 10*3/uL (ref 0.7–4.0)
MCH: 29.8 pg (ref 26.0–34.0)
MCHC: 32.2 g/dL (ref 30.0–36.0)
MCV: 92.4 fL (ref 80.0–100.0)
Monocytes Absolute: 0.5 10*3/uL (ref 0.1–1.0)
Monocytes Relative: 11 %
Neutro Abs: 2.6 10*3/uL (ref 1.7–7.7)
Neutrophils Relative %: 54 %
Platelets: 219 10*3/uL (ref 150–400)
RBC: 3.56 MIL/uL — ABNORMAL LOW (ref 3.87–5.11)
RDW: 12.2 % (ref 11.5–15.5)
WBC: 4.8 10*3/uL (ref 4.0–10.5)
nRBC: 0 % (ref 0.0–0.2)

## 2021-07-09 LAB — BASIC METABOLIC PANEL
Anion gap: 5 (ref 5–15)
BUN: 16 mg/dL (ref 8–23)
CO2: 29 mmol/L (ref 22–32)
Calcium: 9.8 mg/dL (ref 8.9–10.3)
Chloride: 104 mmol/L (ref 98–111)
Creatinine, Ser: 0.67 mg/dL (ref 0.44–1.00)
GFR, Estimated: >60 mL/min (ref >60)
Glucose, Bld: 104 mg/dL — ABNORMAL HIGH (ref 70–99)
Potassium: 3.8 mmol/L (ref 3.5–5.1)
Sodium: 138 mmol/L (ref 135–145)

## 2021-07-09 LAB — BRAIN NATRIURETIC PEPTIDE: B Natriuretic Peptide: 24.9 pg/mL (ref 0.0–100.0)

## 2021-07-09 NOTE — ED Notes (Signed)
Patient informed sort staff she was leaving ?

## 2021-07-09 NOTE — ED Triage Notes (Signed)
Pt BIB Ems complains of bl leg pain with edema. Began yesterday. Elevated BP, No CP, SOB.  She thinks she takes a diuretic that is in her blood pressure medicine ?

## 2021-07-09 NOTE — ED Provider Triage Note (Signed)
Emergency Medicine Provider Triage Evaluation Note ? ?Haley Long , a 71 y.o. female  was evaluated in triage.  Pt complains of bl leg pain with edema. Began yesterday. Elevated BP, No CP, SOB.  She thinks she takes a diuretic that is in her blood pressure medicine.  No recent changes to medications.  No PND, orthopnea.  No new redness to lower extremities.  No history of PE or DVT. ? ?Review of Systems  ?Positive: Bl LE pain and swelling ?Negative:  ? ?Physical Exam  ?There were no vitals taken for this visit. ?Gen:   Awake, no distress   ?Resp:  Normal effort  ?MSK:   Moves extremities without difficulty, 2= pitting edema to BLE ?Other:   ? ?Medical Decision Making  ?Medically screening exam initiated at 12:34 AM.  Appropriate orders placed.  Avian A Carollo was informed that the remainder of the evaluation will be completed by another provider, this initial triage assessment does not replace that evaluation, and the importance of remaining in the ED until their evaluation is complete. ? ?LE edema ?  ?Meron Bocchino A, PA-C ?07/09/21 0039 ? ?

## 2022-03-07 IMAGING — MG MM DIGITAL SCREENING BILAT W/ TOMO AND CAD
6 of 10 series · 6 of 30 positions shown · non-contrast
Comparison: Previous exam(s).

CLINICAL DATA: Screening.

EXAM:
DIGITAL SCREENING BILATERAL MAMMOGRAM WITH TOMOSYNTHESIS AND CAD
TECHNIQUE: Bilateral screening digital craniocaudal and mediolateral oblique
mammograms were obtained. Bilateral screening digital breast
tomosynthesis was performed. The images were evaluated with
computer-aided detection.

[R MLO synth-2D (1 of 2)]
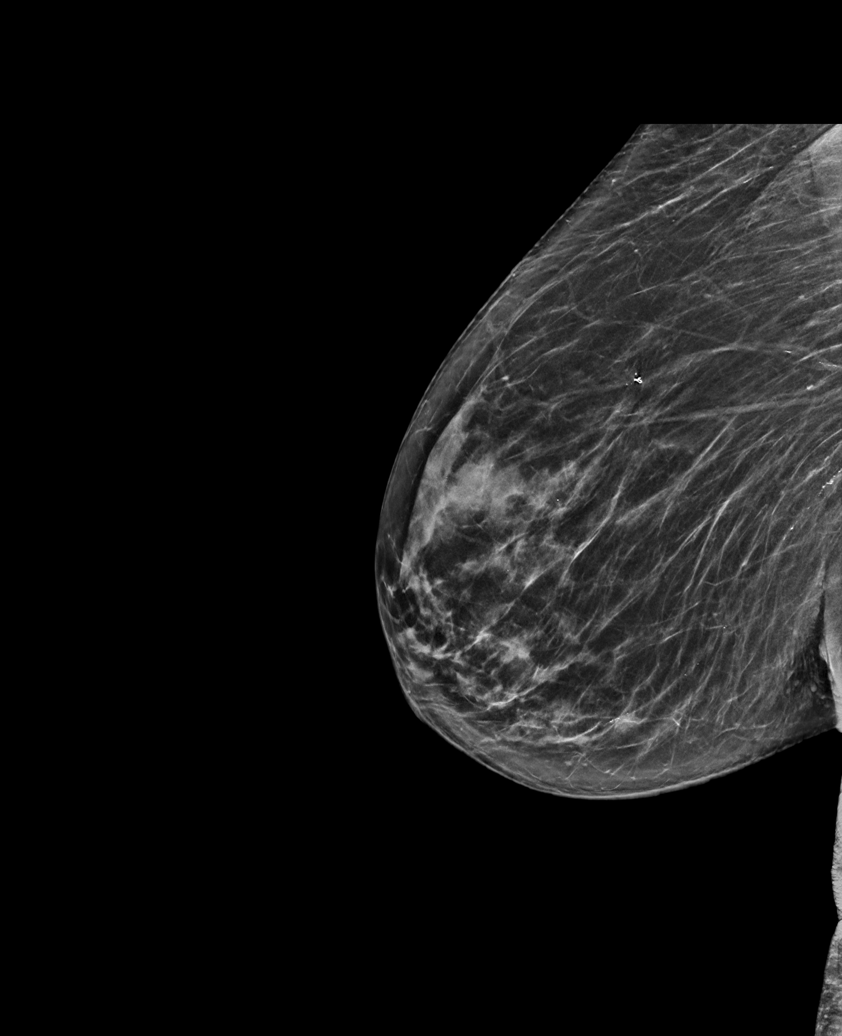

[L MLO synth-2D]
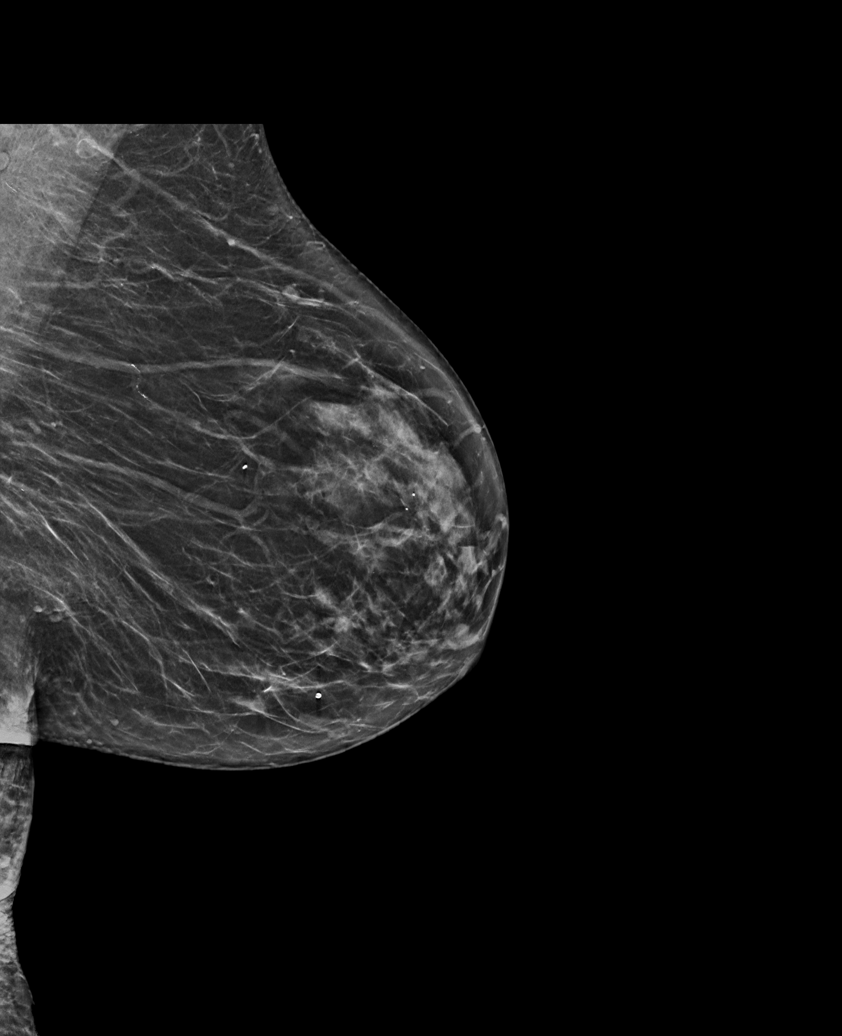

[L CC synth-2D]
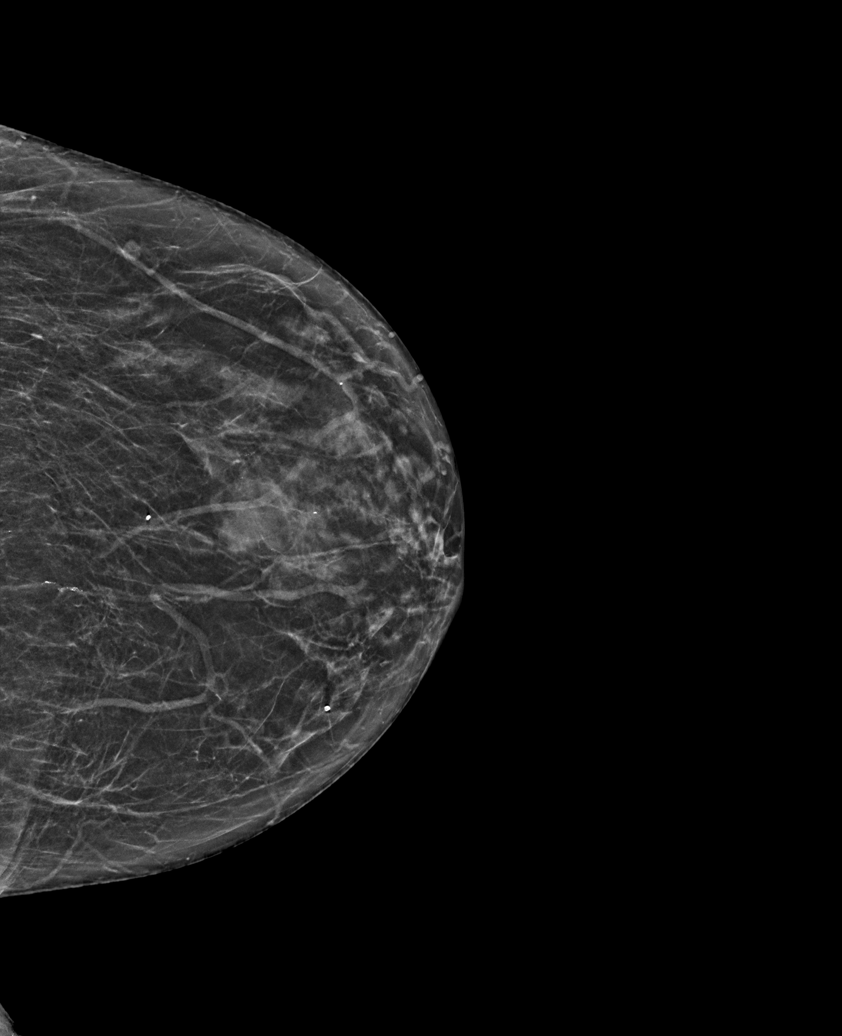

[R MLO synth-2D (2 of 2)]
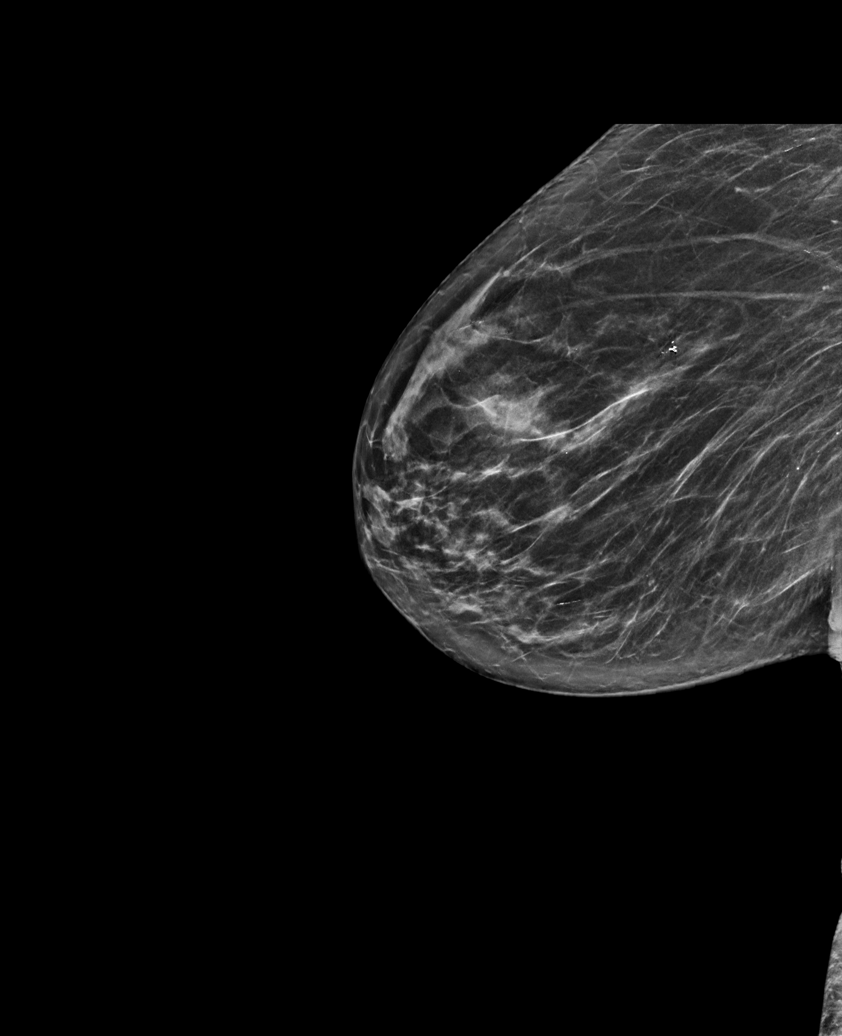

[R CC synth-2D]
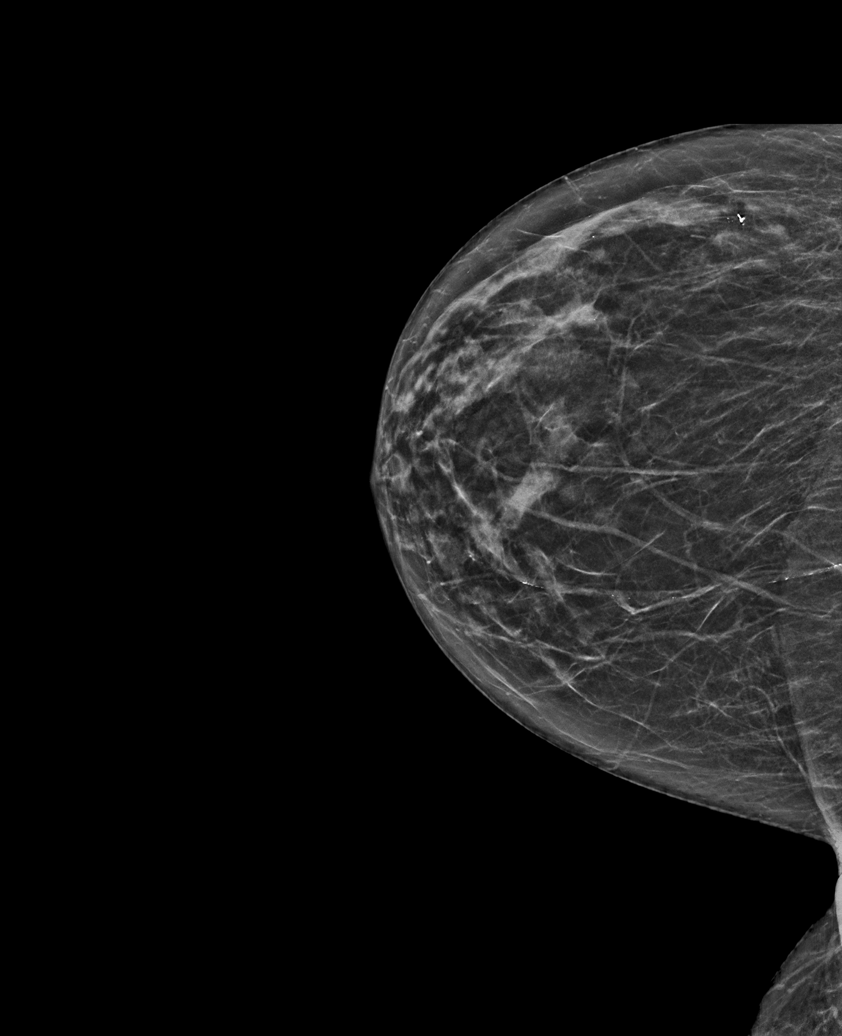

[R MLO tomo · tomo slice 33/64.0]
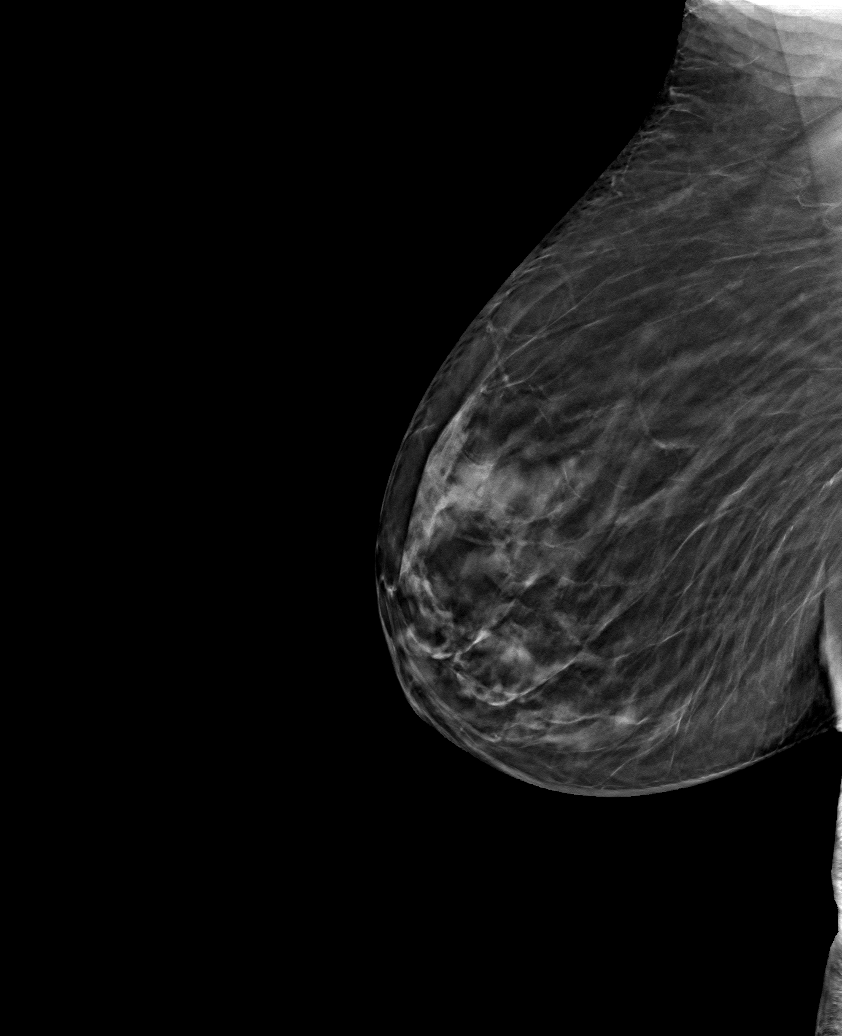

[6 of 30 positions shown; findings below may reference images not displayed]

ACR Breast Density Category c: The breast tissue is heterogeneously
dense, which may obscure small masses.
FINDINGS: There are no findings suspicious for malignancy. The images were
evaluated with computer-aided detection.
IMPRESSION: No mammographic evidence of malignancy. A result letter of this
screening mammogram will be mailed directly to the patient.

RECOMMENDATION:
Screening mammogram in one year. (Code:T4-5-GWO)

BI-RADS CATEGORY  1: Negative.

## 2022-03-31 ENCOUNTER — Emergency Department (HOSPITAL_COMMUNITY)
Admission: EM | Admit: 2022-03-31 | Discharge: 2022-04-01 | Disposition: A | Discharge status: Home / Self Care | Payer: Medicare HMO | Attending: Emergency Medicine | Admitting: Emergency Medicine

## 2022-03-31 DIAGNOSIS — M79662 Pain in left lower leg: Secondary | ICD-10-CM | POA: Diagnosis not present

## 2022-03-31 DIAGNOSIS — M79605 Pain in left leg: Secondary | ICD-10-CM

## 2022-03-31 DIAGNOSIS — M79604 Pain in right leg: Secondary | ICD-10-CM | POA: Diagnosis present

## 2022-03-31 DIAGNOSIS — M79661 Pain in right lower leg: Secondary | ICD-10-CM | POA: Insufficient documentation

## 2022-03-31 NOTE — ED Provider Triage Note (Signed)
Emergency Medicine Provider Triage Evaluation Note  Haley Long , a 72 y.o. female  was evaluated in triage.  Pt complains of leg pain bilaterally. States they have been hurting for 2-3 days. She describes it as an aching pain. No recent falls or trauma. Denies recent fevers, body aches or chills. States her legs usually hurt. She states she takes hydrocodone. Has been taking it without relief.   Review of Systems  Positive: See above Negative:   Physical Exam  BP (!) 168/85 (BP Location: Right Arm)   Pulse 72   Temp 98.3 F (36.8 C) (Oral)   Resp 16   Ht '5\' 1"'$  (1.549 m)   Wt 66.7 kg   SpO2 100%   BMI 27.78 kg/m  Gen:   Awake, no distress   Resp:  Normal effort  MSK:   Moves extremities without difficulty  Other:  BLE swelling (chronic)  Medical Decision Making  Medically screening exam initiated at 10:51 PM.  Appropriate orders placed.  Attie A Renner was informed that the remainder of the evaluation will be completed by another provider, this initial triage assessment does not replace that evaluation, and the importance of remaining in the ED until their evaluation is complete.     Mickie Hillier, PA-C 03/31/22 [phone]

## 2022-03-31 NOTE — ED Triage Notes (Signed)
Pt from home by [facility] reports bilat leg pain from hip to ankle. Hx of arthrtits, but home meds didn't work. Ambulatory.

## 2022-04-01 ENCOUNTER — Emergency Department (HOSPITAL_COMMUNITY): Payer: Medicare HMO

## 2022-04-01 LAB — CBC WITH DIFFERENTIAL/PLATELET
Abs Immature Granulocytes: 0.05 10*3/uL (ref 0.00–0.07)
Basophils Absolute: 0.1 10*3/uL (ref 0.0–0.1)
Basophils Relative: 1 %
Eosinophils Absolute: 0.2 10*3/uL (ref 0.0–0.5)
Eosinophils Relative: 3 %
HCT: 33 % — ABNORMAL LOW (ref 36.0–46.0)
Hemoglobin: 11.8 g/dL — ABNORMAL LOW (ref 12.0–15.0)
Immature Granulocytes: 1 %
Lymphocytes Relative: 26 %
Lymphs Abs: 1.3 10*3/uL (ref 0.7–4.0)
MCH: 32.1 pg (ref 26.0–34.0)
MCHC: 35.8 g/dL (ref 30.0–36.0)
MCV: 89.7 fL (ref 80.0–100.0)
Monocytes Absolute: 0.4 10*3/uL (ref 0.1–1.0)
Monocytes Relative: 8 %
Neutro Abs: 2.9 10*3/uL (ref 1.7–7.7)
Neutrophils Relative %: 61 %
Platelets: 307 10*3/uL (ref 150–400)
RBC: 3.68 MIL/uL — ABNORMAL LOW (ref 3.87–5.11)
RDW: 12.5 % (ref 11.5–15.5)
WBC: 4.8 10*3/uL (ref 4.0–10.5)
nRBC: 0 % (ref 0.0–0.2)

## 2022-04-01 LAB — BASIC METABOLIC PANEL
Anion gap: 7 (ref 5–15)
BUN: 14 mg/dL (ref 8–23)
CO2: 26 mmol/L (ref 22–32)
Calcium: 9.5 mg/dL (ref 8.9–10.3)
Chloride: 100 mmol/L (ref 98–111)
Creatinine, Ser: 0.74 mg/dL (ref 0.44–1.00)
GFR, Estimated: >60 mL/min (ref >60)
Glucose, Bld: 112 mg/dL — ABNORMAL HIGH (ref 70–99)
Potassium: 3.5 mmol/L (ref 3.5–5.1)
Sodium: 133 mmol/L — ABNORMAL LOW (ref 135–145)

## 2022-04-01 LAB — MAGNESIUM: Magnesium: 2.2 mg/dL (ref 1.7–2.4)

## 2022-04-01 MED ORDER — CEPHALEXIN 500 MG PO CAPS: 500.0000 mg | ORAL_CAPSULE | Freq: Two times a day (BID) | ORAL | 0 refills | Status: DC

## 2022-04-01 MED ORDER — CEPHALEXIN 500 MG PO CAPS
500.0000 mg | ORAL_CAPSULE | Freq: Once | ORAL | Status: AC
  Administered 2022-04-01: 500 mg via ORAL
  Filled 2022-04-01: qty 1

## 2022-04-01 MED ORDER — FUROSEMIDE 40 MG PO TABS: 40.0000 mg | ORAL_TABLET | Freq: Every day | ORAL | 0 refills | Status: AC

## 2022-04-01 NOTE — ED Provider Notes (Signed)
[facility] Emergency Department Provider Note MRN:  [id number]  Arrival date & time: 04/01/22     Chief Complaint   Leg Pain   History of Present Illness   Haley Long is a 72 y.o. year-old female presents to the ED with chief complaint of bilateral leg pain.  Reports hx of the same.  States that she has leg swelling and burs on her feet that cause her pain.  She states that she does take a fluid pill and hydrocodone, but says it hasn't been working.  She denies any fevers or chills.  Denies injury or trauma.  Denies SOB or CP.  History provided by patient.   Review of Systems  Pertinent positive and negative review of systems noted in HPI.    Physical Exam   Vitals:   03/31/22 2245 04/01/22 0259  BP: (!) 168/85 (!) 155/86  Pulse: 72 66  Resp: 16 18  Temp: 98.3 F (36.8 C) 98.1 F (36.7 C)  SpO2: 100% 100%    CONSTITUTIONAL:  well-appearing, NAD NEURO:  Alert and oriented x 3, CN 3-12 grossly intact EYES:  eyes equal and reactive ENT/NECK:  Supple, no stridor  CARDIO:  normal rat, regular rhythm, appears well-perfused  PULM:  No respiratory distress, CTAB GI/GU:  non-distended,  MSK/SPINE:  No gross deformities, moderate lower extremity edema SKIN:  calluses on bilateral feet, faint erythema to the left shin, otherwise skin appears normal   *Additional and/or pertinent findings included in MDM below  Diagnostic and Interventional Summary    EKG Interpretation  Date/Time:    Ventricular Rate:    PR Interval:    QRS Duration:   QT Interval:    QTC Calculation:   R Axis:     Text Interpretation:         Labs Reviewed  BASIC METABOLIC PANEL - Abnormal; Notable for the following components:      Result Value   Sodium 133 (*)    Glucose, Bld 112 (*)    All other components within normal limits  CBC WITH DIFFERENTIAL/PLATELET - Abnormal; Notable for the following components:   RBC 3.68 (*)    Hemoglobin 11.8 (*)    HCT 33.0  (*)    All other components within normal limits  MAGNESIUM    DG Chest 2 View  Final Result      Medications  cephALEXin (KEFLEX) capsule 500 mg (500 mg Oral Given 04/01/22 0250)     Procedures  /  Critical Care Procedures  ED Course and Medical Decision Making  I have reviewed the triage vital signs, the nursing notes, and pertinent available records from the EMR.  Social Determinants Affecting Complexity of Care: Patient has no clinically significant social determinants affecting this chief complaint..   ED Course:    Medical Decision Making Patient is well appearing.  She does have some swelling on both her legs.  She is wearing compression stockings.  She has some mild erythema to the left leg, which could be developing cellulitis, but might also be inflammatory.  Will cover with keflex.  Will have her increase her lasix for a few days and follow-up with PCP.  Doubt PE or DVT.  No sign abscess.  No evidence of serious infection tonight.  Denies trauma.    I'll have her follow-up with podiatry for her calluses on her feet.  Will have her follow-up with PCP for leg swelling.  Return to ER for new or worsening symptoms.  Amount and/or Complexity of Data Reviewed Radiology: ordered.  Risk Prescription drug management.     Consultants: No consultations were needed in caring for this patient.   Treatment and Plan: I considered admission due to patient's initial presentation, but after considering the examination and diagnostic results, patient will not require admission and can be discharged with outpatient follow-up.    Final Clinical Impressions(s) / ED Diagnoses     ICD-10-CM   1. Pain in both lower extremities  M79.604    M79.605       ED Discharge Orders          Ordered    cephALEXin (KEFLEX) 500 MG capsule  2 times daily        04/01/22 0229    furosemide (LASIX) 40 MG tablet  Daily        04/01/22 0229              Discharge Instructions  Discussed with and Provided to Patient:     Discharge Instructions      Please schedule an appointment with your doctor.  Please follow-up with the podiatrist listed.  Take medications as prescribed.       Montine Circle, PA-C 04/01/22 1771    Merryl Hacker, MD 04/01/22 [phone]

## 2022-04-01 NOTE — Discharge Instructions (Signed)
Please schedule an appointment with your doctor.  Please follow-up with the podiatrist listed.  Take medications as prescribed.

## 2022-06-05 ENCOUNTER — Emergency Department (HOSPITAL_COMMUNITY)
Admission: EM | Admit: 2022-06-05 | Discharge: 2022-06-05 | Disposition: A | Discharge status: Home / Self Care | Payer: Medicare HMO | Attending: Emergency Medicine | Admitting: Emergency Medicine

## 2022-06-05 DIAGNOSIS — I110 Hypertensive heart disease with heart failure: Secondary | ICD-10-CM | POA: Diagnosis not present

## 2022-06-05 DIAGNOSIS — T7840XA Allergy, unspecified, initial encounter: Secondary | ICD-10-CM | POA: Diagnosis not present

## 2022-06-05 DIAGNOSIS — R21 Rash and other nonspecific skin eruption: Secondary | ICD-10-CM | POA: Diagnosis present

## 2022-06-05 DIAGNOSIS — I2510 Atherosclerotic heart disease of native coronary artery without angina pectoris: Secondary | ICD-10-CM | POA: Diagnosis not present

## 2022-06-05 DIAGNOSIS — I509 Heart failure, unspecified: Secondary | ICD-10-CM | POA: Insufficient documentation

## 2022-06-05 DIAGNOSIS — Z79899 Other long term (current) drug therapy: Secondary | ICD-10-CM | POA: Diagnosis not present

## 2022-06-05 MED ORDER — DEXAMETHASONE SODIUM PHOSPHATE 10 MG/ML IJ SOLN
10.0000 mg | Freq: Once | INTRAMUSCULAR | Status: AC
  Administered 2022-06-05: 10 mg via INTRAVENOUS
  Filled 2022-06-05: qty 1

## 2022-06-05 MED ORDER — EPINEPHRINE 0.3 MG/0.3ML IJ SOAJ: 0.3000 mg | INTRAMUSCULAR | 0 refills | Status: AC | PRN

## 2022-06-05 MED ORDER — DIPHENHYDRAMINE HCL 50 MG/ML IJ SOLN
25.0000 mg | Freq: Once | INTRAMUSCULAR | Status: AC
  Administered 2022-06-05: 25 mg via INTRAVENOUS
  Filled 2022-06-05: qty 1

## 2022-06-05 MED ORDER — PREDNISONE 50 MG PO TABS: 50.0000 mg | ORAL_TABLET | Freq: Every day | ORAL | 0 refills | Status: DC

## 2022-06-05 MED ORDER — FAMOTIDINE IN NACL 20-0.9 MG/50ML-% IV SOLN
20.0000 mg | Freq: Once | INTRAVENOUS | Status: AC
  Administered 2022-06-05: 20 mg via INTRAVENOUS
  Filled 2022-06-05: qty 50

## 2022-06-05 NOTE — ED Provider Triage Note (Signed)
Emergency Medicine Provider Triage Evaluation Note  Haley Long , a 72 y.o. female  was evaluated in triage.  Pt complains of possible allergic reaction after eating a biscuit at noon.  She feels like her throat is itching on both sides and closing up on her, also feels nauseated and has abdominal pain, vomited once.  Has never used an EpiPen previously, denies any chest pain or shortness of breath.  Is been progressively worsening over the last few hours..  Review of Systems  Per HPI  Physical Exam  BP (!) 144/93 (BP Location: Right Arm)   Pulse 82   Temp 99.2 F (37.3 C) (Oral)   Resp 18   SpO2 99%  Gen:   Awake, no distress   Resp:  Normal effort  MSK:   Moves extremities without difficulty  Other:  Uvula midline, no stridor.  Medical Decision Making  Medically screening exam initiated at 5:12 PM.  Appropriate orders placed.  Haley Long was informed that the remainder of the evaluation will be completed by another provider, this initial triage assessment does not replace that evaluation, and the importance of remaining in the ED until their evaluation is complete.  Charge aware, patient needs room as will require epinephrine given 2 system involvement    Sherrill Raring, [place] 06/05/22 1713

## 2022-06-05 NOTE — ED Notes (Signed)
Patient reports itching and rash have resolved but she still feels nauseated.

## 2022-06-05 NOTE — ED Provider Notes (Signed)
Supervised resident visit.  Patient here after some breathing difficulty and rash after eating a biscuit.  This occurred around noon about 6 hours ago.  She got some Benadryl and steroids and feeling much better.  May be some history of allergies to food dye in the past.  She is having some nausea earlier but improving.  Some abdominal discomfort that is improved.  She says rash is gone away.  There is no swelling of her lips or tongue.  She is clear breath sounds.  Abdominal exam is benign.  She has a history of heart failure.  Ultimately patient was observed for several hours with only improvement.  Conservatively will give her prescription for EpiPen.  Will have her continue prednisone and Benadryl at home.  Will have her follow-up with her primary care doctor.  Discharged in good condition.  Overall suspect mild food allergy.  This chart was dictated using voice recognition software.  Despite best efforts to proofread,  errors can occur which can change the documentation meaning.    Lennice Sites, DO 06/05/22 1824

## 2022-06-05 NOTE — Discharge Instructions (Addendum)
You likely had a reaction called anaphylaxis, your symptoms resolved without any medications by the time he reached the emergency department.  This type of reaction is life-threatening, I am sending you home with a prescription for an EpiPen.  Listed in your instructions are the reasons to take your EpiPen including hives with difficulty breathing, difficulty breathing by itself, hives with dizziness or weakness, severe abdominal pain with rash or if you believe you are having a severe reaction please use the EpiPen and call EMS.  I am sending you home with a short course of prednisone, please take as prescribed.

## 2022-06-05 NOTE — ED Triage Notes (Signed)
Patient here with complaint of "throat itching", nausea, and abdominal discomfort that started after eating a biscuit at noon today. Patient is alert, oriented, speaking in complete sentences, and is in no apparent distress at this time.

## 2022-06-05 NOTE — ED Provider Notes (Signed)
[facility] Provider Note   CSN: [id number] Arrival date & time: 06/05/22  1705     History  Chief Complaint  Patient presents with   Allergic Reaction    Haley Long is a 72 y.o. female.  This is a 72 year old female with history of coronary artery disease, hypertension, CHF on Lasix presenting to the ED for an allergic reaction.  Patient states 5 hours ago she was eating a biscuit with mustard, she started having a scratchy throat, developed a raised rash along her neck bilaterally, as well as abdominal pain.  She waited to come in and her symptoms started improving.  She states her itchy throat has improved, her rash is also improved, she is still experiencing mild abdominal pain.  Denies any previous anaphylactic reactions, has never used an EpiPen.        Home Medications Prior to Admission medications   Medication Sig Start Date End Date Taking? Authorizing Provider  cephALEXin (KEFLEX) 500 MG capsule Take 1 capsule (500 mg total) by mouth 2 (two) times daily. 04/01/22   Montine Circle, PA-C  cholecalciferol (VITAMIN D) 1000 units tablet Take 1,000 Units by mouth daily.    [provider]  diphenhydrAMINE-zinc acetate (BENADRYL EXTRA STRENGTH) cream Apply 1 application topically 3 (three) times daily as needed for itching. 03/26/18   Couture, Cortni S, PA-C  doxycycline (VIBRAMYCIN) 100 MG capsule Take 1 capsule (100 mg total) by mouth 2 (two) times daily. 08/08/19   Muthersbaugh, Jarrett Soho, PA-C  ferrous sulfate 325 (65 FE) MG tablet Take 1 tablet by mouth daily.    [provider]  furosemide (LASIX) 20 MG tablet Take 1 tablet (20 mg total) by mouth daily for 3 days. 01/24/20 01/27/20  Gareth Morgan, MD  furosemide (LASIX) 40 MG tablet Take 1 tablet (40 mg total) by mouth daily. 04/01/22   Montine Circle, PA-C  HYDROcodone-acetaminophen (NORCO/VICODIN) 5-325 MG tablet Take 1 tablet by mouth 4 (four) times daily  as needed for moderate pain.    [provider]  lisinopril-hydrochlorothiazide (PRINZIDE,ZESTORETIC) 10-12.5 MG tablet Take 1 tablet by mouth daily.    [provider]  Multiple Vitamin (MULITIVITAMIN WITH MINERALS) TABS Take 1 tablet by mouth daily.    [provider]  omeprazole (PRILOSEC) 20 MG capsule Take 20 mg by mouth daily.    [provider]  oxyCODONE-acetaminophen (PERCOCET) 5-325 MG tablet Take 1 tablet by mouth every 4 (four) hours as needed. 01/15/18   Diona Browner, DMD  predniSONE (STERAPRED UNI-PAK 21 TAB) 10 MG (21) TBPK tablet Take by mouth daily. Take 6 tabs by mouth daily  for 2 days, then 5 tabs for 2 days, then 4 tabs for 2 days, then 3 tabs for 2 days, 2 tabs for 2 days, then 1 tab by mouth daily for 2 days 03/26/18   Couture, Cortni S, PA-C  pregabalin (LYRICA) 75 MG capsule Take 75 mg by mouth 3 (three) times daily as needed.     [provider]      Allergies    Clonazepam and Tramadol    Review of Systems   Review of Systems  Constitutional:  Negative for chills and fever.  Respiratory:  Negative for cough and shortness of breath.   Cardiovascular:  Negative for chest pain.  Gastrointestinal:  Positive for abdominal pain.  Skin:  Positive for rash.  Neurological:  Negative for dizziness and weakness.    Physical Exam Updated Vital Signs BP Marland Kitchen)  144/93 (BP Location: Right Arm)   Pulse 82   Temp 99.2 F (37.3 C) (Oral)   Resp 18   SpO2 99%  Physical Exam Vitals and nursing note reviewed.  Constitutional:      General: She is not in acute distress.    Appearance: She is well-developed.  HENT:     Head: Normocephalic and atraumatic.  Eyes:     Conjunctiva/sclera: Conjunctivae normal.  Cardiovascular:     Rate and Rhythm: Normal rate and regular rhythm.     Heart sounds: No murmur heard. Pulmonary:     Effort: Pulmonary effort is normal. No respiratory distress.     Breath sounds: Normal breath sounds.   Abdominal:     Palpations: Abdomen is soft.     Tenderness: There is no abdominal tenderness.  Musculoskeletal:        General: No swelling.     Cervical back: Neck supple.  Skin:    General: Skin is warm and dry.     Capillary Refill: Capillary refill takes less than 2 seconds.     Findings: No rash.  Neurological:     General: No focal deficit present.     Mental Status: She is alert and oriented to person, place, and time.  Psychiatric:        Mood and Affect: Mood normal.     ED Results / Procedures / Treatments   Labs (all labs ordered are listed, but only abnormal results are displayed) Labs Reviewed - No data to display  EKG None  Radiology No results found.  Procedures Procedures    Medications Ordered in ED Medications  diphenhydrAMINE (BENADRYL) injection 25 mg (25 mg Intravenous Given 06/05/22 1735)  dexamethasone (DECADRON) injection 10 mg (10 mg Intravenous Given 06/05/22 1732)  famotidine (PEPCID) IVPB 20 mg premix (20 mg Intravenous New Bag/Given 06/05/22 1738)    ED Course/ Medical Decision Making/ A&P                             Medical Decision Making Patient presents with concerning symptoms for anaphylaxis.  She initially had a rash with patient her throat and abdominal pain.  She would have met the criteria for anaphylaxis however she came in 5 hours later and her rash had resolved and was only experiencing mild abdominal pain.  Given her age, her comorbidities of previous coronary artery disease and congestive heart failure, I felt the risks outweighed the benefits for administering epinephrine at this time.  We will give patient Pepcid, Benadryl and steroids, observe patient for a couple hours and reevaluate.  Her vitals are stable, she has no evidence of hypotension, no tachycardia, satting 99% on room air.  On exam her lungs are completely clear to auscultation bilaterally with no evidence of wheezing, her abdomen is soft, nontender nondistended, she  has no obvious rash upon skin exam.  Upon my reevaluation after 3 hours of observation patient did not have any recurrence of symptoms.  It has been approximately 8 hours since her initial allergic reaction.  She states she is feeling much better, her abdominal pain has completely resolved at this point.  She continues to have no rash or difficulty breathing.  I discussed in depth with patient that she likely had a mild anaphylactic reaction and I am sending her home with an EpiPen.  We also sending her home with a short course of steroids and recommend she takes Benadryl.  I  discussed in depth with patient symptoms to watch out for and to take her EpiPen for including rash with difficulty breathing, difficulty breathing right self, rash with abdominal pain, dizziness, or any other emergent concerns that she is having a severe reaction.  We also discussed that she should immediately call EMS if she experiences any symptoms as well as use her EpiPen.  Recommended outpatient follow-up with her PCP.  Patient was stable at discharge  Problems Addressed: Allergic reaction, initial encounter: acute illness or injury that poses a threat to life or bodily functions  Risk Prescription drug management.         Final Clinical Impression(s) / ED Diagnoses Final diagnoses:  None    Rx / DC Orders ED Discharge Orders     None         Jimmie Molly, MD 06/05/22 2029    Lennice Sites, DO 06/05/22 2126

## 2022-06-07 ENCOUNTER — Other Ambulatory Visit: Payer: Self-pay

## 2022-06-07 ENCOUNTER — Emergency Department (HOSPITAL_COMMUNITY)
Admission: EM | Admit: 2022-06-07 | Discharge: 2022-06-08 | Disposition: A | Discharge status: Home / Self Care | Payer: Medicare HMO | Attending: Emergency Medicine | Admitting: Emergency Medicine

## 2022-06-07 ENCOUNTER — Encounter (HOSPITAL_COMMUNITY): Payer: Self-pay | Admitting: Emergency Medicine

## 2022-06-07 DIAGNOSIS — R079 Chest pain, unspecified: Secondary | ICD-10-CM | POA: Insufficient documentation

## 2022-06-07 DIAGNOSIS — M79662 Pain in left lower leg: Secondary | ICD-10-CM | POA: Insufficient documentation

## 2022-06-07 DIAGNOSIS — M79604 Pain in right leg: Secondary | ICD-10-CM

## 2022-06-07 DIAGNOSIS — M79661 Pain in right lower leg: Secondary | ICD-10-CM | POA: Insufficient documentation

## 2022-06-07 DIAGNOSIS — R600 Localized edema: Secondary | ICD-10-CM | POA: Insufficient documentation

## 2022-06-07 MED ORDER — ALUM & MAG HYDROXIDE-SIMETH 200-200-20 MG/5ML PO SUSP
30.0000 mL | Freq: Once | ORAL | Status: AC
  Administered 2022-06-07: 30 mL via ORAL
  Filled 2022-06-07: qty 30

## 2022-06-07 MED ORDER — LIDOCAINE VISCOUS HCL 2 % MT SOLN
15.0000 mL | Freq: Once | OROMUCOSAL | Status: AC
  Administered 2022-06-07: 15 mL via ORAL
  Filled 2022-06-07: qty 15

## 2022-06-07 MED ORDER — PANTOPRAZOLE SODIUM 40 MG PO TBEC
40.0000 mg | DELAYED_RELEASE_TABLET | Freq: Once | ORAL | Status: AC
  Administered 2022-06-07: 40 mg via ORAL
  Filled 2022-06-07: qty 1

## 2022-06-07 NOTE — ED Triage Notes (Signed)
Per EMS, pt from home c/o bilateral leg numbness noticing it early this morning.  She can feel touch and is ambulatory   154/90 Pulse 82 99% RA  Pt reports some pain under "my rib cage."

## 2022-06-08 ENCOUNTER — Emergency Department (HOSPITAL_COMMUNITY): Payer: Medicare HMO

## 2022-06-08 DIAGNOSIS — M79661 Pain in right lower leg: Secondary | ICD-10-CM | POA: Diagnosis not present

## 2022-06-08 LAB — CBC WITH DIFFERENTIAL/PLATELET
Abs Immature Granulocytes: 0.01 10*3/uL (ref 0.00–0.07)
Basophils Absolute: 0 10*3/uL (ref 0.0–0.1)
Basophils Relative: 1 %
Eosinophils Absolute: 0.1 10*3/uL (ref 0.0–0.5)
Eosinophils Relative: 2 %
HCT: 32.5 % — ABNORMAL LOW (ref 36.0–46.0)
Hemoglobin: 11 g/dL — ABNORMAL LOW (ref 12.0–15.0)
Immature Granulocytes: 0 %
Lymphocytes Relative: 24 %
Lymphs Abs: 1.4 10*3/uL (ref 0.7–4.0)
MCH: 30.1 pg (ref 26.0–34.0)
MCHC: 33.8 g/dL (ref 30.0–36.0)
MCV: 88.8 fL (ref 80.0–100.0)
Monocytes Absolute: 0.5 10*3/uL (ref 0.1–1.0)
Monocytes Relative: 8 %
Neutro Abs: 4 10*3/uL (ref 1.7–7.7)
Neutrophils Relative %: 65 %
Platelets: 244 10*3/uL (ref 150–400)
RBC: 3.66 MIL/uL — ABNORMAL LOW (ref 3.87–5.11)
RDW: 12.3 % (ref 11.5–15.5)
WBC: 6.1 10*3/uL (ref 4.0–10.5)
nRBC: 0 % (ref 0.0–0.2)

## 2022-06-08 LAB — COMPREHENSIVE METABOLIC PANEL
ALT: 18 U/L (ref 0–44)
AST: 29 U/L (ref 15–41)
Albumin: 4.3 g/dL (ref 3.5–5.0)
Alkaline Phosphatase: 38 U/L (ref 38–126)
Anion gap: 12 (ref 5–15)
BUN: 16 mg/dL (ref 8–23)
CO2: 28 mmol/L (ref 22–32)
Calcium: 9.7 mg/dL (ref 8.9–10.3)
Chloride: 92 mmol/L — ABNORMAL LOW (ref 98–111)
Creatinine, Ser: 0.89 mg/dL (ref 0.44–1.00)
GFR, Estimated: >60 mL/min (ref >60)
Glucose, Bld: 117 mg/dL — ABNORMAL HIGH (ref 70–99)
Potassium: 3.3 mmol/L — ABNORMAL LOW (ref 3.5–5.1)
Sodium: 132 mmol/L — ABNORMAL LOW (ref 135–145)
Total Bilirubin: 0.6 mg/dL (ref 0.3–1.2)
Total Protein: 6.8 g/dL (ref 6.5–8.1)

## 2022-06-08 LAB — LIPASE, BLOOD: Lipase: 46 U/L (ref 11–51)

## 2022-06-08 LAB — BRAIN NATRIURETIC PEPTIDE: B Natriuretic Peptide: 37.6 pg/mL (ref 0.0–100.0)

## 2022-06-08 LAB — TROPONIN I (HIGH SENSITIVITY)
Troponin I (High Sensitivity): 5 ng/L (ref <18)
Troponin I (High Sensitivity): 8 ng/L (ref <18)

## 2022-06-08 MED ORDER — OXYCODONE-ACETAMINOPHEN 5-325 MG PO TABS
2.0000 | ORAL_TABLET | Freq: Once | ORAL | Status: AC
  Administered 2022-06-08: 2 via ORAL
  Filled 2022-06-08: qty 2

## 2022-06-08 NOTE — ED Notes (Signed)
Patient d/c at home, called sister but did not answer. Patient decided to use a cab. [facility] was called and transport was arranged.

## 2022-06-08 NOTE — ED Provider Notes (Signed)
[facility] Provider Note   CSN: [id number] Arrival date & time: 06/07/22  2340     History  Chief Complaint  Patient presents with   Leg Pain    Haley Long is a 72 y.o. female.  Patient with multiple complaints.  Initially stating that both of her legs were burning similar to previous visits.  Then subsequently said that her upper stomach hurt as well.  She states compliance with all her medications.  No recent fevers, cough, nausea, vomiting, diarrhea or constipation.  No trauma.  No rashes.   Leg Pain      Home Medications Prior to Admission medications   Medication Sig Start Date End Date Taking? Authorizing Provider  cholecalciferol (VITAMIN D) 1000 units tablet Take 1,000 Units by mouth daily.   Yes [provider]  EPINEPHrine 0.3 mg/0.3 mL IJ SOAJ injection Inject 0.3 mg into the muscle as needed for anaphylaxis. 06/05/22  Yes Jimmie Molly, MD  ferrous sulfate 325 (65 FE) MG tablet Take 1 tablet by mouth daily.   Yes [provider]  furosemide (LASIX) 40 MG tablet Take 1 tablet (40 mg total) by mouth daily. 04/01/22  Yes Montine Circle, PA-C  HYDROcodone-acetaminophen (NORCO) 7.5-325 MG tablet Take 1 tablet by mouth every 6 (six) hours as needed for moderate pain.   Yes [provider]  lisinopril-hydrochlorothiazide (PRINZIDE,ZESTORETIC) 10-12.5 MG tablet Take 1 tablet by mouth daily.   Yes [provider]  Multiple Vitamin (MULITIVITAMIN WITH MINERALS) TABS Take 1 tablet by mouth daily.   Yes [provider]  omeprazole (PRILOSEC) 20 MG capsule Take 20 mg by mouth daily as needed (heartburn).   Yes [provider]  pregabalin (LYRICA) 75 MG capsule Take 75 mg by mouth 3 (three) times daily as needed (pain).   Yes [provider]  furosemide (LASIX) 20 MG tablet Take 1 tablet (20 mg total) by mouth daily for 3 days. 01/24/20 01/27/20  Gareth Morgan, MD   oxyCODONE-acetaminophen (PERCOCET) 5-325 MG tablet Take 1 tablet by mouth every 4 (four) hours as needed. Patient not taking: Reported on 06/08/2022 01/15/18   Diona Browner, DMD  predniSONE (DELTASONE) 50 MG tablet Take 1 tablet (50 mg total) by mouth daily. Patient not taking: Reported on 06/08/2022 06/05/22   Jimmie Molly, MD      Allergies    Clonazepam and Tramadol    Review of Systems   Review of Systems  Physical Exam Updated Vital Signs BP (!) 135/55   Pulse (!) 58   Temp 97.8 F (36.6 C) (Oral)   Resp 15   Ht '5\' 1"'$  (1.549 m)   Wt 66.7 kg   SpO2 92%   BMI 27.78 kg/m  Physical Exam Vitals and nursing note reviewed.  Constitutional:      Appearance: She is well-developed.  HENT:     Head: Normocephalic and atraumatic.     Mouth/Throat:     Mouth: Mucous membranes are moist.  Eyes:     Pupils: Pupils are equal, round, and reactive to light.  Cardiovascular:     Rate and Rhythm: Normal rate and regular rhythm.  Pulmonary:     Effort: No respiratory distress.     Breath sounds: No stridor.  Abdominal:     General: Abdomen is flat. There is no distension.  Musculoskeletal:        General: Normal range of motion.     Cervical back: Normal range of motion.  Skin:    General: Skin is warm and dry.     Findings: Erythema and rash (venous stasis BLE) present.  Neurological:     General: No focal deficit present.     Mental Status: She is alert.     ED Results / Procedures / Treatments   Labs (all labs ordered are listed, but only abnormal results are displayed) Labs Reviewed  CBC WITH DIFFERENTIAL/PLATELET - Abnormal; Notable for the following components:      Result Value   RBC 3.66 (*)    Hemoglobin 11.0 (*)    HCT 32.5 (*)    All other components within normal limits  COMPREHENSIVE METABOLIC PANEL - Abnormal; Notable for the following components:   Sodium 132 (*)    Potassium 3.3 (*)    Chloride 92 (*)    Glucose, Bld 117 (*)    All other components  within normal limits  LIPASE, BLOOD  BRAIN NATRIURETIC PEPTIDE  TROPONIN I (HIGH SENSITIVITY)  TROPONIN I (HIGH SENSITIVITY)    EKG EKG Interpretation  Date/Time:  Saturday June 07 2022 23:49:26 EST Ventricular Rate:  81 PR Interval:  177 QRS Duration: 114 QT Interval:  383 QTC Calculation: 445 R Axis:   56 Text Interpretation: Sinus rhythm Borderline intraventricular conduction delay Confirmed by Merrily Pew [id number]) on 06/08/2022 12:17:42 AM  Radiology DG Chest 2 View  Result Date: 06/08/2022 CLINICAL DATA:  Upper abdominal pain. EXAM: CHEST - 2 VIEW COMPARISON:  Chest radiograph dated 04/01/2022. FINDINGS: No focal consolidation, pleural effusion, pneumothorax. The cardiac silhouette is within normal limits. Atherosclerotic calcification of the aorta. No acute osseous pathology. IMPRESSION: No active cardiopulmonary disease. Electronically Signed   By: Anner Crete M.D.   On: 06/08/2022 00:37    Procedures Procedures    Medications Ordered in ED Medications  alum & mag hydroxide-simeth (MAALOX/MYLANTA) 200-200-20 MG/5ML suspension 30 mL (30 mLs Oral Given 06/07/22 2355)    And  lidocaine (XYLOCAINE) 2 % viscous mouth solution 15 mL (15 mLs Oral Given 06/07/22 2355)  pantoprazole (PROTONIX) EC tablet 40 mg (40 mg Oral Given 06/07/22 2355)  oxyCODONE-acetaminophen (PERCOCET/ROXICET) 5-325 MG per tablet 2 tablet (2 tablets Oral Given 06/08/22 0143)    ED Course/ Medical Decision Making/ A&P                             Medical Decision Making Amount and/or Complexity of Data Reviewed Labs: ordered. Radiology: ordered. ECG/medicine tests: ordered.  Risk OTC drugs. Prescription drug management.   Workup for symptoms are reassuring.  Upper stomach pain seem to have improved with the GI cocktail and her cardiac workup was negative.  She does have venous stasis and some swelling of her lower extremities will continue taking her Lasix at home.  No evidence of acute heart  failure or pulmonary edema.  PCP follow-up otherwise return here for new or worsening symptoms.  Final Clinical Impression(s) / ED Diagnoses Final diagnoses:  Pain in both lower extremities  Chest pain, unspecified type    Rx / DC Orders ED Discharge Orders     None         Cabrina Shiroma, Corene Cornea, MD 06/08/22 2317

## 2022-11-28 LAB — EXTERNAL GENERIC LAB PROCEDURE: COLOGUARD: NEGATIVE

## 2022-11-28 LAB — COLOGUARD: COLOGUARD: NEGATIVE

## 2022-12-28 ENCOUNTER — Encounter (HOSPITAL_BASED_OUTPATIENT_CLINIC_OR_DEPARTMENT_OTHER): Payer: Self-pay | Admitting: Emergency Medicine

## 2022-12-28 ENCOUNTER — Emergency Department (HOSPITAL_BASED_OUTPATIENT_CLINIC_OR_DEPARTMENT_OTHER)
Admission: EM | Admit: 2022-12-28 | Discharge: 2022-12-28 | Disposition: A | Discharge status: Home / Self Care | Payer: Medicare HMO | Attending: Emergency Medicine | Admitting: Emergency Medicine

## 2022-12-28 ENCOUNTER — Other Ambulatory Visit: Payer: Self-pay

## 2022-12-28 DIAGNOSIS — L03113 Cellulitis of right upper limb: Secondary | ICD-10-CM | POA: Insufficient documentation

## 2022-12-28 DIAGNOSIS — R21 Rash and other nonspecific skin eruption: Secondary | ICD-10-CM | POA: Diagnosis present

## 2022-12-28 MED ORDER — CEPHALEXIN 250 MG PO CAPS
500.0000 mg | ORAL_CAPSULE | Freq: Once | ORAL | Status: AC
  Administered 2022-12-28: 500 mg via ORAL
  Filled 2022-12-28: qty 2

## 2022-12-28 MED ORDER — CEPHALEXIN 500 MG PO CAPS: 500.0000 mg | ORAL_CAPSULE | Freq: Four times a day (QID) | ORAL | 0 refills | Status: DC

## 2022-12-28 NOTE — Discharge Instructions (Signed)
You were seen in the emergency room for cellulitis We gave you a dose of Keflex here and prescribed the rest for you to pick up from your pharmacy Please pick up this medication and take as directed Return to the emergency department for fevers, chills, worsening pain or any other concerns Otherwise please follow-up with your primary care doctor in 1 week for reevaluation

## 2022-12-28 NOTE — ED Provider Notes (Signed)
[facility] EMERGENCY DEPARTMENT AT [facility] Provider Note   CSN: [id number] Arrival date & time: 12/28/22  1552     History  No chief complaint on file.   Haley Long is a 72 y.o. female.  Who presents to ED for rash.  Patient fears she was bitten by bedbugs last night and stated she woke up this morning with bites all over her.  She has had rashes related to bites from bedbugs, bees and other insects in the past.  No trouble breathing nausea vomiting or other systemic symptoms.  HPI     Home Medications Prior to Admission medications   Medication Sig Start Date End Date Taking? Authorizing Provider  cephALEXin (KEFLEX) 500 MG capsule Take 1 capsule (500 mg total) by mouth 4 (four) times daily. 12/28/22  Yes Estelle June A, DO  cholecalciferol (VITAMIN D) 1000 units tablet Take 1,000 Units by mouth daily.    [provider]  EPINEPHrine 0.3 mg/0.3 mL IJ SOAJ injection Inject 0.3 mg into the muscle as needed for anaphylaxis. 06/05/22   Florene Route, MD  ferrous sulfate 325 (65 FE) MG tablet Take 1 tablet by mouth daily.    [provider]  furosemide (LASIX) 20 MG tablet Take 1 tablet (20 mg total) by mouth daily for 3 days. 01/24/20 01/27/20  Alvira Monday, MD  furosemide (LASIX) 40 MG tablet Take 1 tablet (40 mg total) by mouth daily. 04/01/22   Roxy Horseman, PA-C  HYDROcodone-acetaminophen (NORCO) 7.5-325 MG tablet Take 1 tablet by mouth every 6 (six) hours as needed for moderate pain.    [provider]  lisinopril-hydrochlorothiazide (PRINZIDE,ZESTORETIC) 10-12.5 MG tablet Take 1 tablet by mouth daily.    [provider]  Multiple Vitamin (MULITIVITAMIN WITH MINERALS) TABS Take 1 tablet by mouth daily.    [provider]  omeprazole (PRILOSEC) 20 MG capsule Take 20 mg by mouth daily as needed (heartburn).    [provider]  oxyCODONE-acetaminophen (PERCOCET) 5-325 MG tablet Take 1 tablet by mouth every 4  (four) hours as needed. Patient not taking: Reported on 06/08/2022 01/15/18   Ocie Doyne, DMD  predniSONE (DELTASONE) 50 MG tablet Take 1 tablet (50 mg total) by mouth daily. Patient not taking: Reported on 06/08/2022 06/05/22   Florene Route, MD  pregabalin (LYRICA) 75 MG capsule Take 75 mg by mouth 3 (three) times daily as needed (pain).    [provider]      Allergies    Clonazepam and Tramadol    Review of Systems   Review of Systems  Skin:  Positive for rash.  All other systems reviewed and are negative.   Physical Exam Updated Vital Signs BP 135/72 (BP Location: Right Arm)   Pulse (!) 102   Temp 97.7 F (36.5 C) (Oral)   Resp 18   SpO2 97%  Physical Exam Vitals and nursing note reviewed.  HENT:     Head: Normocephalic and atraumatic.  Cardiovascular:     Rate and Rhythm: Normal rate and regular rhythm.  Pulmonary:     Effort: Pulmonary effort is normal.  Abdominal:     General: There is no distension.  Skin:    Comments: 3 x 5 cm patch of erythema over medial right mid arm with increased warmth No fluctuance or drainage No appreciable insect bites  Neurological:     Mental Status: She is alert.  Psychiatric:        Mood and Affect: Mood normal.  ED Results / Procedures / Treatments   Labs (all labs ordered are listed, but only abnormal results are displayed) Labs Reviewed - No data to display  EKG None  Radiology No results found.  Procedures Procedures    Medications Ordered in ED Medications  cephALEXin (KEFLEX) capsule 500 mg (has no administration in time range)    ED Course/ Medical Decision Making/ A&P                                 Medical Decision Making 72 year old female presenting for suspected insect bite.  She voiced concern for potential bedbugs.  On exam she has 1 large patch of erythema over her right arm with increased warmth but no other evidence of bites or stings.  No concern for allergic reaction or  anaphylaxis.  Will treat for uncomplicated cellulitis with outpatient antibiotics (Keflex) and instruct for close PCP follow-up.  Return precautions discussed with her and family ember at bedside in detail           Final Clinical Impression(s) / ED Diagnoses Final diagnoses:  Cellulitis of right upper extremity    Rx / DC Orders ED Discharge Orders          Ordered    cephALEXin (KEFLEX) 500 MG capsule  4 times daily        12/28/22 1944              Royanne Foots, DO 12/28/22 1944

## 2022-12-28 NOTE — ED Triage Notes (Signed)
"  Something bit me last night" Reports waking up with red and swelling in right side face, back of neck and right elbow area. Warm, tender to touch and painful

## 2023-07-14 ENCOUNTER — Encounter (HOSPITAL_BASED_OUTPATIENT_CLINIC_OR_DEPARTMENT_OTHER): Payer: Self-pay

## 2023-07-14 ENCOUNTER — Emergency Department (HOSPITAL_BASED_OUTPATIENT_CLINIC_OR_DEPARTMENT_OTHER)
Admission: EM | Admit: 2023-07-14 | Discharge: 2023-07-14 | Disposition: A | Discharge status: Home / Self Care | Attending: Emergency Medicine | Admitting: Emergency Medicine

## 2023-07-14 ENCOUNTER — Emergency Department (HOSPITAL_BASED_OUTPATIENT_CLINIC_OR_DEPARTMENT_OTHER): Admitting: Radiology

## 2023-07-14 DIAGNOSIS — I2510 Atherosclerotic heart disease of native coronary artery without angina pectoris: Secondary | ICD-10-CM | POA: Diagnosis not present

## 2023-07-14 DIAGNOSIS — M79672 Pain in left foot: Secondary | ICD-10-CM | POA: Diagnosis not present

## 2023-07-14 DIAGNOSIS — Z79899 Other long term (current) drug therapy: Secondary | ICD-10-CM | POA: Insufficient documentation

## 2023-07-14 DIAGNOSIS — I10 Essential (primary) hypertension: Secondary | ICD-10-CM | POA: Diagnosis not present

## 2023-07-14 DIAGNOSIS — Z8542 Personal history of malignant neoplasm of other parts of uterus: Secondary | ICD-10-CM | POA: Diagnosis not present

## 2023-07-14 DIAGNOSIS — M79671 Pain in right foot: Secondary | ICD-10-CM | POA: Diagnosis present

## 2023-07-14 MED ORDER — MELOXICAM 7.5 MG PO TABS: 7.5000 mg | ORAL_TABLET | Freq: Every day | ORAL | 0 refills | Status: AC

## 2023-07-14 NOTE — ED Notes (Signed)
Warm blankets given  Family at bedside  

## 2023-07-14 NOTE — ED Triage Notes (Signed)
 Patient reports pain in both feet. States the wright foot hurts worse. States she has bone spurs in the foot.Was scheduled to have surgery but her sister passed away and she did not want to miss the funeral.

## 2023-07-14 NOTE — Discharge Instructions (Addendum)
 Please make an appointment with the podiatrist to see if he can offer anything to help with your pain.

## 2023-07-14 NOTE — ED Notes (Signed)
 Patient did not know that her medication could be taken 3 times a day and she brought her medication with her. Patient took her 5 mg norco.

## 2023-07-14 NOTE — ED Provider Notes (Signed)
 [place] EMERGENCY DEPARTMENT AT [facility] Provider Note   CSN: [id number] Arrival date & time: 07/14/23  0156     History  Chief Complaint  Patient presents with   Foot Pain    Haley Long is a 73 y.o. female.  The history is provided by the patient.  Foot Pain  She has history of hypertension, coronary artery disease, peripheral vascular disease, uterine cancer and comes in complaining of ongoing bilateral foot pain.  She states that she has been having foot pain for a long time and was supposed to have heel spurs removed but the doctor who she was seeing is now no longer doing surgery.  She does take hydrocodone-acetaminophen which does give her temporary relief of pain.  Symptoms are no worse today than they usually are.   Home Medications Prior to Admission medications   Medication Sig Start Date End Date Taking? Authorizing Provider  meloxicam (MOBIC) 7.5 MG tablet Take 1 tablet (7.5 mg total) by mouth daily. 07/14/23  Yes Alissa April, MD  cholecalciferol (VITAMIN D) 1000 units tablet Take 1,000 Units by mouth daily.    [provider]  EPINEPHrine 0.3 mg/0.3 mL IJ SOAJ injection Inject 0.3 mg into the muscle as needed for anaphylaxis. 06/05/22   Sharene Dauer, MD  ferrous sulfate 325 (65 FE) MG tablet Take 1 tablet by mouth daily.    [provider]  furosemide (LASIX) 20 MG tablet Take 1 tablet (20 mg total) by mouth daily for 3 days. 01/24/20 01/27/20  Scarlette Currier, MD  furosemide (LASIX) 40 MG tablet Take 1 tablet (40 mg total) by mouth daily. 04/01/22   Sherel Dikes, PA-C  HYDROcodone-acetaminophen (NORCO) 7.5-325 MG tablet Take 1 tablet by mouth every 6 (six) hours as needed for moderate pain.    [provider]  lisinopril-hydrochlorothiazide (PRINZIDE,ZESTORETIC) 10-12.5 MG tablet Take 1 tablet by mouth daily.    [provider]  Multiple Vitamin (MULITIVITAMIN WITH MINERALS) TABS Take 1 tablet by mouth daily.     [provider]  omeprazole (PRILOSEC) 20 MG capsule Take 20 mg by mouth daily as needed (heartburn).    [provider]  pregabalin (LYRICA) 75 MG capsule Take 75 mg by mouth 3 (three) times daily as needed (pain).    [provider]      Allergies    Clonazepam and Tramadol    Review of Systems   Review of Systems  All other systems reviewed and are negative.   Physical Exam Updated Vital Signs BP (!) 136/55   Pulse (!) 59   Temp 98 F (36.7 C)   Resp 20   SpO2 93%  Physical Exam Vitals and nursing note reviewed.   73 year old female, resting comfortably and in no acute distress. Vital signs are normal. Oxygen saturation is 93%, which is normal. Head is normocephalic and atraumatic. PERRLA, EOMI.  Lungs are clear without rales, wheezes, or rhonchi. Chest is nontender. Heart has regular rate and rhythm without murmur. Abdomen is soft, flat, nontender. Extremities have no cyanosis or edema. There is mild tenderness to palpation over the plantar surface of the heels bilaterally. Skin is warm and dry without rash. Neurologic: Mental status is normal, moves all extremities equally.  ED Results / Procedures / Treatments    Radiology DG Foot Complete Left Result Date: 07/14/2023 CLINICAL DATA:  Left foot pain, no known injury, initial encounter EXAM: LEFT FOOT - COMPLETE 3+ VIEW COMPARISON:  None Available. FINDINGS: Mild hallux  valgus deformity is noted. No acute fracture or dislocation is seen. No soft tissue changes are noted. Mild calcaneal spurring is noted. IMPRESSION: Mild degenerative change without acute abnormality. Electronically Signed   By: Violeta Grey M.D.   On: 07/14/2023 02:46   DG Foot Complete Right Result Date: 07/14/2023 CLINICAL DATA:  Bilateral foot pain common no known injury, initial encounter EXAM: RIGHT FOOT COMPLETE - 3+ VIEW COMPARISON:  None Available. FINDINGS: Mild hallux valgus deformity is noted. No acute fracture or  dislocation is seen. No soft tissue abnormality is noted. Mild plantar calcaneal spurring is noted. IMPRESSION: Mild degenerative change without acute abnormality. Electronically Signed   By: Violeta Grey M.D.   On: 07/14/2023 02:45    Procedures Procedures    Medications Ordered in ED Medications - No data to display  ED Course/ Medical Decision Making/ A&P                                 Medical Decision Making Amount and/or Complexity of Data Reviewed Radiology: ordered.   Chronic bilateral foot pain.  X-rays show mild degenerative changes without acute abnormality.  I have independently viewed all of the images, and agree with the radiologist's interpretation.  I actually do not see significant bone spurs on her x-rays.  I have reviewed her prior records and she gets 90 tablets of hydrocodone-acetaminophen 7.5-325 every month.  I feel she might benefit from adding an NSAID to her regimen so I have ordered a prescription for 2-week supply of meloxicam.  I have referred her to podiatry for evaluation.  Final Clinical Impression(s) / ED Diagnoses Final diagnoses:  Bilateral foot pain    Rx / DC Orders ED Discharge Orders          Ordered    meloxicam (MOBIC) 7.5 MG tablet  Daily        07/14/23 0613              Alissa April, MD 07/14/23 [phone]

## 2023-07-21 ENCOUNTER — Ambulatory Visit: Admitting: Podiatry

## 2023-07-23 ENCOUNTER — Encounter: Payer: Self-pay | Admitting: Podiatry

## 2023-07-23 ENCOUNTER — Ambulatory Visit: Admitting: Podiatry

## 2023-07-23 VITALS — Ht 61.0 in | Wt 147.5 lb

## 2023-07-23 DIAGNOSIS — M722 Plantar fascial fibromatosis: Secondary | ICD-10-CM

## 2023-07-23 DIAGNOSIS — L84 Corns and callosities: Secondary | ICD-10-CM | POA: Diagnosis not present

## 2023-07-23 NOTE — Patient Instructions (Signed)
Look for urea 40% cream or ointment and apply to the thickened dry skin / calluses. This can be bought over the counter, at a pharmacy or online such as [employer].     Plantar Fasciitis (Heel Spur Syndrome) with Rehab The plantar fascia is a fibrous, ligament-like, soft-tissue structure that spans the bottom of the foot. Plantar fasciitis is a condition that causes pain in the foot due to inflammation of the tissue. SYMPTOMS   Pain and tenderness on the underneath side of the foot.  Pain that worsens with standing or walking. CAUSES  Plantar fasciitis is caused by irritation and injury to the plantar fascia on the underneath side of the foot. Common mechanisms of injury include:  Direct trauma to bottom of the foot.  Damage to a small nerve that runs under the foot where the main fascia attaches to the heel bone.  Stress placed on the plantar fascia due to bone spurs. RISK INCREASES WITH:   Activities that place stress on the plantar fascia (running, jumping, pivoting, or cutting).  Poor strength and flexibility.  Improperly fitted shoes.  Tight calf muscles.  Flat feet.  Failure to warm-up properly before activity.  Obesity. PREVENTION  Warm up and stretch properly before activity.  Allow for adequate recovery between workouts.  Maintain physical fitness:  Strength, flexibility, and endurance.  Cardiovascular fitness.  Maintain a health body weight.  Avoid stress on the plantar fascia.  Wear properly fitted shoes, including arch supports for individuals who have flat feet.  PROGNOSIS  If treated properly, then the symptoms of plantar fasciitis usually resolve without surgery. However, occasionally surgery is necessary.  RELATED COMPLICATIONS   Recurrent symptoms that may result in a chronic condition.  Problems of the lower back that are caused by compensating for the injury, such as limping.  Pain or weakness of the foot during push-off following  surgery.  Chronic inflammation, scarring, and partial or complete fascia tear, occurring more often from repeated injections.  TREATMENT  Treatment initially involves the use of ice and medication to help reduce pain and inflammation. The use of strengthening and stretching exercises may help reduce pain with activity, especially stretches of the Achilles tendon. These exercises may be performed at home or with a therapist. Your caregiver may recommend that you use heel cups of arch supports to help reduce stress on the plantar fascia. Occasionally, corticosteroid injections are given to reduce inflammation. If symptoms persist for greater than 6 months despite non-surgical (conservative), then surgery may be recommended.   MEDICATION   If pain medication is necessary, then nonsteroidal anti-inflammatory medications, such as aspirin and ibuprofen, or other minor pain relievers, such as acetaminophen, are often recommended.  Do not take pain medication within 7 days before surgery.  Prescription pain relievers may be given if deemed necessary by your caregiver. Use only as directed and only as much as you need.  Corticosteroid injections may be given by your caregiver. These injections should be reserved for the most serious cases, because they may only be given a certain number of times.  HEAT AND COLD  Cold treatment (icing) relieves pain and reduces inflammation. Cold treatment should be applied for 10 to 15 minutes every 2 to 3 hours for inflammation and pain and immediately after any activity that aggravates your symptoms. Use ice packs or massage the area with a piece of ice (ice massage).  Heat treatment may be used prior to performing the stretching and strengthening activities prescribed by your caregiver, physical   therapist, or athletic trainer. Use a heat pack or soak the injury in warm water.  SEEK IMMEDIATE MEDICAL CARE IF:  Treatment seems to offer no benefit, or the condition  worsens.  Any medications produce adverse side effects.  EXERCISES- RANGE OF MOTION (ROM) AND STRETCHING EXERCISES - Plantar Fasciitis (Heel Spur Syndrome) These exercises may help you when beginning to rehabilitate your injury. Your symptoms may resolve with or without further involvement from your physician, physical therapist or athletic trainer. While completing these exercises, remember:   Restoring tissue flexibility helps normal motion to return to the joints. This allows healthier, less painful movement and activity.  An effective stretch should be held for at least 30 seconds.  A stretch should never be painful. You should only feel a gentle lengthening or release in the stretched tissue.  RANGE OF MOTION - Toe Extension, Flexion  Sit with your right / left leg crossed over your opposite knee.  Grasp your toes and gently pull them back toward the top of your foot. You should feel a stretch on the bottom of your toes and/or foot.  Hold this stretch for 10 seconds.  Now, gently pull your toes toward the bottom of your foot. You should feel a stretch on the top of your toes and or foot.  Hold this stretch for 10 seconds. Repeat  times. Complete this stretch 3 times per day.   RANGE OF MOTION - Ankle Dorsiflexion, Active Assisted  Remove shoes and sit on a chair that is preferably not on a carpeted surface.  Place right / left foot under knee. Extend your opposite leg for support.  Keeping your heel down, slide your right / left foot back toward the chair until you feel a stretch at your ankle or calf. If you do not feel a stretch, slide your bottom forward to the edge of the chair, while still keeping your heel down.  Hold this stretch for 10 seconds. Repeat 3 times. Complete this stretch 2 times per day.   STRETCH  Gastroc, Standing  Place hands on wall.  Extend right / left leg, keeping the front knee somewhat bent.  Slightly point your toes inward on your back  foot.  Keeping your right / left heel on the floor and your knee straight, shift your weight toward the wall, not allowing your back to arch.  You should feel a gentle stretch in the right / left calf. Hold this position for 10 seconds. Repeat 3 times. Complete this stretch 2 times per day.  STRETCH  Soleus, Standing  Place hands on wall.  Extend right / left leg, keeping the other knee somewhat bent.  Slightly point your toes inward on your back foot.  Keep your right / left heel on the floor, bend your back knee, and slightly shift your weight over the back leg so that you feel a gentle stretch deep in your back calf.  Hold this position for 10 seconds. Repeat 3 times. Complete this stretch 2 times per day.  STRETCH  Gastrocsoleus, Standing  Note: This exercise can place a lot of stress on your foot and ankle. Please complete this exercise only if specifically instructed by your caregiver.   Place the ball of your right / left foot on a step, keeping your other foot firmly on the same step.  Hold on to the wall or a rail for balance.  Slowly lift your other foot, allowing your body weight to press your heel down over the   edge of the step.  You should feel a stretch in your right / left calf.  Hold this position for 10 seconds.  Repeat this exercise with a slight bend in your right / left knee. Repeat 3 times. Complete this stretch 2 times per day.   STRENGTHENING EXERCISES - Plantar Fasciitis (Heel Spur Syndrome)  These exercises may help you when beginning to rehabilitate your injury. They may resolve your symptoms with or without further involvement from your physician, physical therapist or athletic trainer. While completing these exercises, remember:   Muscles can gain both the endurance and the strength needed for everyday activities through controlled exercises.  Complete these exercises as instructed by your physician, physical therapist or athletic trainer. Progress  the resistance and repetitions only as guided.  STRENGTH - Towel Curls  Sit in a chair positioned on a non-carpeted surface.  Place your foot on a towel, keeping your heel on the floor.  Pull the towel toward your heel by only curling your toes. Keep your heel on the floor. Repeat 3 times. Complete this exercise 2 times per day.  STRENGTH - Ankle Inversion  Secure one end of a rubber exercise band/tubing to a fixed object (table, pole). Loop the other end around your foot just before your toes.  Place your fists between your knees. This will focus your strengthening at your ankle.  Slowly, pull your big toe up and in, making sure the band/tubing is positioned to resist the entire motion.  Hold this position for 10 seconds.  Have your muscles resist the band/tubing as it slowly pulls your foot back to the starting position. Repeat 3 times. Complete this exercises 2 times per day.  Document Released: 03/17/2005 Document Revised: 06/09/2011 Document Reviewed: 06/29/2008 [employer] Patient Information 2014 [employer].  

## 2023-07-24 ENCOUNTER — Encounter: Payer: Self-pay | Admitting: Podiatry

## 2023-07-24 MED ORDER — UREA 45 % EX CREA: 1.0000 g | TOPICAL_CREAM | Freq: Every day | CUTANEOUS | 3 refills | Status: AC

## 2023-07-24 NOTE — Progress Notes (Signed)
 Subjective:  Patient ID: Haley Long, female    DOB: September 15, 1950,  MRN: [id number]  Chief Complaint  Patient presents with   Heel Spurs    Pt is here due to heel spurs in bilateral feet.    Discussed the use of AI scribe software for clinical note transcription with the patient, who gave verbal consent to proceed.  History of Present Illness Haley Long is a 73 year old female who presents with bilateral heel pain and callus formation.  She experiences bilateral heel pain, with the right heel being more painful. The pain is described as aching and sore, and it has been persistent. No recent injuries are reported, but she recalls a past injury to her little toe while playing ball at school, which has been aching lately.  She has a history of a small heel spur on both feet, as identified in an x-ray taken at urgent care on July 14, 2023. She also has a large plantar callus on the right heel, which is very painful, and a less pronounced callus on the left heel and distal fifth metatarsal on the right.  She mentions having knee pain, particularly in the back of the knee, but states it is not as severe as the heel pain. She also reports having sore hands.  She expresses concern about the appearance of bone growth on her foot, which she describes as looking like 'bone growing outside of her foot'. She identifies this as a source of pain, which is confirmed to be a corn or callus.      Objective:    Physical Exam VASCULAR: DP and PT pulse palpable. Foot is warm and well-perfused. Capillary fill time is brisk. DERMATOLOGIC: Normal skin turgor, texture, and temperature.  Large plantar callus on right heel, less on left heel and distal fifth metatarsal on right. NEUROLOGIC: Normal sensation to light touch and pressure. No paresthesias on examination. ORTHOPEDIC: Smooth pain-free range of motion of all examined joints. No ecchymosis or bruising. No gross deformity. Pain at plantar medial  band of plantar fascia insertion on heel. Tenderness on foot examination.   No images are attached to the encounter.    Results Procedure: Corticosteroid injection Description: Following consent and preparation with alcohol, the bilateral heel and insertion of the medial band of the plantar fascia on the calcaneus were injected with 20 mg of Kenalog, 4 mg of dexamethasone , and 0.5 cc of 0.5% Marcaine plain. The area was dressed appropriately. The patient tolerated the procedure well.  Procedure: Callus debridement Description: Callus on the plantar right heel was debrided sharply with a scalpel. Routine callus debridements are not covered by her current insurance as she does not have any qualifying conditions such as peripheral artery disease (PAD), diabetes, or polyneuropathy.  RADIOLOGY Heel X-ray: Small heel spurs, no fracture or stress fracture (07/14/2023)   Assessment:   1. Plantar fasciitis of left foot   2. Plantar fasciitis, right   3. Callus of foot      Plan:  Patient was evaluated and treated and all questions answered.  Assessment and Plan Assessment & Plan Plantar fasciitis Chronic plantar fasciitis with inflammation of the plantar fascia at the heel insertion, bilaterally, with the right foot more symptomatic. Previous radiographs showed small heel spurs, no fractures. Corticosteroid injection recommended to reduce inflammation and pain, with effects lasting several months. Physical therapy advised to strengthen foot muscles and stretch calf muscles. - Administer corticosteroid injection with 20 mg Kenalog, 4 mg dexamethasone , and 0.5  cc of 0.5% Marcaine plain to the bilateral heel and insertion of the medial band of the plantar fascia on the calcaneus. - Recommend home physical therapy to strengthen foot muscles and stretch calf muscles.  Exercises given  Heel spurs Small heel spurs on radiographs of both feet. Surgical removal not recommended as most patients do  not require it. Pain management through conservative measures such as corticosteroid injection and physical therapy. Explained that bone spur removal involves significant surgery, which is not advised currently.  Plantar callus Painful plantar callus on the right heel, less so on the left heel and distal fifth metatarsal on the right. Callus debridement performed to alleviate pain. Discussed that routine callus debridement is not covered by insurance without qualifying conditions. Explained that callus may recur over time and recommended topical treatment.  We discussed that it can be debrided here but would be an out-of-pocket expense an ABN will need to be signed. - Debride callus on the plantar right heel with a scalpel. - Recommend using urea cream and a pumice stone to reduce calluses.      Return if symptoms worsen or fail to improve.

## 2023-08-06 ENCOUNTER — Emergency Department (HOSPITAL_COMMUNITY)
Admission: EM | Admit: 2023-08-06 | Discharge: 2023-08-06 | Disposition: A | Discharge status: Home / Self Care | Attending: Emergency Medicine | Admitting: Emergency Medicine

## 2023-08-06 ENCOUNTER — Emergency Department (HOSPITAL_COMMUNITY)

## 2023-08-06 ENCOUNTER — Other Ambulatory Visit: Payer: Self-pay

## 2023-08-06 DIAGNOSIS — Z79899 Other long term (current) drug therapy: Secondary | ICD-10-CM | POA: Diagnosis not present

## 2023-08-06 DIAGNOSIS — I10 Essential (primary) hypertension: Secondary | ICD-10-CM | POA: Insufficient documentation

## 2023-08-06 DIAGNOSIS — Z8542 Personal history of malignant neoplasm of other parts of uterus: Secondary | ICD-10-CM | POA: Insufficient documentation

## 2023-08-06 DIAGNOSIS — M546 Pain in thoracic spine: Secondary | ICD-10-CM | POA: Insufficient documentation

## 2023-08-06 DIAGNOSIS — I2510 Atherosclerotic heart disease of native coronary artery without angina pectoris: Secondary | ICD-10-CM | POA: Diagnosis not present

## 2023-08-06 DIAGNOSIS — M545 Low back pain, unspecified: Secondary | ICD-10-CM | POA: Insufficient documentation

## 2023-08-06 DIAGNOSIS — Z87891 Personal history of nicotine dependence: Secondary | ICD-10-CM | POA: Diagnosis not present

## 2023-08-06 LAB — URINALYSIS, ROUTINE W REFLEX MICROSCOPIC
Bilirubin Urine: NEGATIVE
Glucose, UA: NEGATIVE mg/dL
Hgb urine dipstick: NEGATIVE
Ketones, ur: NEGATIVE mg/dL
Leukocytes,Ua: NEGATIVE
Nitrite: NEGATIVE
Protein, ur: NEGATIVE mg/dL
Specific Gravity, Urine: 1.004 — ABNORMAL LOW (ref 1.005–1.030)
pH: 8 (ref 5.0–8.0)

## 2023-08-06 LAB — COMPREHENSIVE METABOLIC PANEL WITH GFR
ALT: 20 U/L (ref 0–44)
AST: 27 U/L (ref 15–41)
Albumin: 4.5 g/dL (ref 3.5–5.0)
Alkaline Phosphatase: 34 U/L — ABNORMAL LOW (ref 38–126)
Anion gap: 8 (ref 5–15)
BUN: 18 mg/dL (ref 8–23)
CO2: 28 mmol/L (ref 22–32)
Calcium: 9.7 mg/dL (ref 8.9–10.3)
Chloride: 96 mmol/L — ABNORMAL LOW (ref 98–111)
Creatinine, Ser: 0.83 mg/dL (ref 0.44–1.00)
GFR, Estimated: >60 mL/min (ref >60)
Glucose, Bld: 95 mg/dL (ref 70–99)
Potassium: 3.8 mmol/L (ref 3.5–5.1)
Sodium: 132 mmol/L — ABNORMAL LOW (ref 135–145)
Total Bilirubin: 0.4 mg/dL (ref 0.0–1.2)
Total Protein: 7.1 g/dL (ref 6.5–8.1)

## 2023-08-06 LAB — CBC
HCT: 31.9 % — ABNORMAL LOW (ref 36.0–46.0)
Hemoglobin: 10.4 g/dL — ABNORMAL LOW (ref 12.0–15.0)
MCH: 30.2 pg (ref 26.0–34.0)
MCHC: 32.6 g/dL (ref 30.0–36.0)
MCV: 92.7 fL (ref 80.0–100.0)
Platelets: 253 10*3/uL (ref 150–400)
RBC: 3.44 MIL/uL — ABNORMAL LOW (ref 3.87–5.11)
RDW: 12.7 % (ref 11.5–15.5)
WBC: 6.4 10*3/uL (ref 4.0–10.5)
nRBC: 0 % (ref 0.0–0.2)

## 2023-08-06 MED ORDER — PANTOPRAZOLE SODIUM 40 MG IV SOLR
40.0000 mg | Freq: Once | INTRAVENOUS | Status: AC
  Administered 2023-08-06: 40 mg via INTRAVENOUS
  Filled 2023-08-06: qty 10

## 2023-08-06 MED ORDER — IOHEXOL 300 MG/ML  SOLN
100.0000 mL | Freq: Once | INTRAMUSCULAR | Status: AC | PRN
  Administered 2023-08-06: 100 mL via INTRAVENOUS

## 2023-08-06 MED ORDER — ALUM & MAG HYDROXIDE-SIMETH 200-200-20 MG/5ML PO SUSP
15.0000 mL | Freq: Once | ORAL | Status: AC
  Administered 2023-08-06: 15 mL via ORAL
  Filled 2023-08-06: qty 30

## 2023-08-06 NOTE — Discharge Instructions (Signed)
 You were seen in the emergency department for back pain Your blood work urine tests and CAT scans all looked okay We are not sure what is causing your back pain but is important that you follow-up with your PCP as scheduled Return to the emerged part for severe pain, if you are unable to walk or have any other concerns

## 2023-08-06 NOTE — ED Notes (Signed)
 Patient transported to CT

## 2023-08-06 NOTE — ED Triage Notes (Signed)
 Pt reports pain down spine and from lower back all around to front yesterday evening. Hurts in back with deep inspiration, feels similar to when pt had a lung infection. Took norco this morning around 0930

## 2023-08-06 NOTE — ED Notes (Addendum)
 Family aware pt can not eat or drink until results are reviewed by MD

## 2023-08-06 NOTE — ED Notes (Signed)
 PT IS IN CT SCAN WILL OBTAIN VITALS ON RETURN

## 2023-08-06 NOTE — ED Provider Notes (Signed)
  Physical Exam  BP (!) 150/70   Pulse 73   Temp 98.8 F (37.1 C) (Oral)   Resp 17   SpO2 100%   Physical Exam Vitals and nursing note reviewed.  HENT:     Head: Normocephalic and atraumatic.  Eyes:     Pupils: Pupils are equal, round, and reactive to light.  Cardiovascular:     Rate and Rhythm: Normal rate and regular rhythm.  Pulmonary:     Effort: Pulmonary effort is normal.     Breath sounds: Normal breath sounds.  Abdominal:     Palpations: Abdomen is soft.     Tenderness: There is no abdominal tenderness.  Skin:    General: Skin is warm and dry.  Neurological:     Mental Status: She is alert.  Psychiatric:        Mood and Affect: Mood normal.     Procedures  Procedures  ED Course / MDM   Clinical Course as of 08/06/23 1842  Thu Aug 06, 2023  1837 No acute traumatic findings or findings that explain patient's discomfort on CT abdomen pelvis T-spine L-spine.  She is feeling better after Maalox and Protonix .  No significant laboratory abnormalities.  Able to ambulate here with her rolling walker.  Stable for discharge at this time with PCP follow-up.  She has an upcoming PCP appointment in 2 weeks [MP]    Clinical Course User Index [MP] Sallyanne Creamer, DO   Medical Decision Making I, Rafael Bun DO, have assumed care of this patient from the previous provider pending CTs, reevaluation and disposition  Amount and/or Complexity of Data Reviewed Labs: ordered. Radiology: ordered.  Risk OTC drugs. Prescription drug management.          Sallyanne Creamer, DO 08/06/23 1842

## 2023-08-07 NOTE — ED Provider Notes (Signed)
 [place] EMERGENCY DEPARTMENT AT [facility] Provider Note  CSN: [id number] Arrival date & time: 08/06/23 1302  Chief Complaint(s) Back Pain  HPI Haley Long is a 73 y.o. female with past medical history as below, significant for CAD, hypertension, PVD who presents to the ED with complaint of abdominal pain, back pain, thoracic pain.  Onset yesterday evening, pain primarily to her abdomen, generalized.  Also having thoracic back pain, lower back pain.  No nausea or vomiting.  No significant chest pain, dyspnea, cough, fevers or chills.  No falls, no IV drug use.  No medication prior to arrival.  No change in p.o. intake, no change in bowel or bladder function.   Past Medical History Past Medical History:  Diagnosis Date   Arthritis    Cancer [facility])    uterine   Coronary artery disease    Heart murmur    had forever- not a problem   Hypertension    Irregular heart beat    Kidney stones    Peripheral vascular disease (HCC)    Varicose veins    Patient Active Problem List   Diagnosis Date Noted   Varicose veins of bilateral lower extremities with other complications 01/03/2013   Irregular heart beat    Home Medication(s) Prior to Admission medications   Medication Sig Start Date End Date Taking? Authorizing Provider  cholecalciferol (VITAMIN D) 1000 units tablet Take 1,000 Units by mouth daily.    [provider]  EPINEPHrine  0.3 mg/0.3 mL IJ SOAJ injection Inject 0.3 mg into the muscle as needed for anaphylaxis. 06/05/22   Sharene Dauer, MD  ferrous sulfate 325 (65 FE) MG tablet Take 1 tablet by mouth daily.    [provider]  furosemide  (LASIX ) 20 MG tablet Take 1 tablet (20 mg total) by mouth daily for 3 days. 01/24/20 01/27/20  Scarlette Currier, MD  furosemide  (LASIX ) 40 MG tablet Take 1 tablet (40 mg total) by mouth daily. 04/01/22   Sherel Dikes, PA-C  HYDROcodone -acetaminophen  (NORCO) 7.5-325 MG tablet Take 1 tablet by mouth every 6  (six) hours as needed for moderate pain.    [provider]  lisinopril-hydrochlorothiazide (PRINZIDE,ZESTORETIC) 10-12.5 MG tablet Take 1 tablet by mouth daily.    [provider]  meloxicam  (MOBIC ) 7.5 MG tablet Take 1 tablet (7.5 mg total) by mouth daily. 07/14/23   Alissa April, MD  Multiple Vitamin (MULITIVITAMIN WITH MINERALS) TABS Take 1 tablet by mouth daily.    [provider]  omeprazole (PRILOSEC) 20 MG capsule Take 20 mg by mouth daily as needed (heartburn).    [provider]  pregabalin (LYRICA) 75 MG capsule Take 75 mg by mouth 3 (three) times daily as needed (pain).    [provider]  Urea  45 % CREA Apply 1 g topically daily. 07/24/23   Sikora, Rebecca, DPM  Past Surgical History Past Surgical History:  Procedure Laterality Date   ABDOMINAL HYSTERECTOMY     CATARACT EXTRACTION, BILATERAL     CHOLECYSTECTOMY     ENDOVENOUS ABLATION SAPHENOUS VEIN W/ LASER Left 04-21-2013   left greater saphenous vein and sclerotherapy left leg   EYE SURGERY     PELVIC EXENTERATION     TOOTH EXTRACTION N/A 01/15/2018   Procedure: DENTAL RESTORATION/EXTRACTIONS;  Surgeon: Ascencion Lava, DDS;  Location: [facility] OR;  Service: Oral Surgery;  Laterality: N/A;   Family History Family History  Problem Relation Age of Onset   Hyperlipidemia Mother    Hypertension Mother    Other Mother        varicose veins   Diabetes Father    Hyperlipidemia Father    Hypertension Father    Peripheral vascular disease Father    Diabetes Sister    Hyperlipidemia Sister    Hypertension Sister    Other Sister        varicose veins   Hyperlipidemia Brother    Hypertension Brother    Peripheral vascular disease Brother    Heart disease Daughter        before age 39    Social History Social History   Tobacco Use   Smoking status:  Former    Current packs/day: 0.00    Types: Cigarettes    Quit date: 03/31/1969    Years since quitting: 54.3   Smokeless tobacco: Former  Substance Use Topics   Alcohol use: Yes   Drug use: No   Allergies Clonazepam and Tramadol   Review of Systems A thorough review of systems was obtained and all systems are negative except as noted in the HPI and PMH.   Physical Exam Vital Signs  I have reviewed the triage vital signs BP (!) 150/70   Pulse 73   Temp 98.8 F (37.1 C) (Oral)   Resp 17   SpO2 100%  Physical Exam Vitals and nursing note reviewed.  Constitutional:      General: She is not in acute distress.    Appearance: Normal appearance.  HENT:     Head: Normocephalic and atraumatic.     Right Ear: External ear normal.     Left Ear: External ear normal.     Nose: Nose normal.     Mouth/Throat:     Mouth: Mucous membranes are moist.  Eyes:     General: No scleral icterus.       Right eye: No discharge.        Left eye: No discharge.  Cardiovascular:     Rate and Rhythm: Normal rate and regular rhythm.     Pulses: Normal pulses.     Heart sounds: Normal heart sounds.  Pulmonary:     Effort: Pulmonary effort is normal. No respiratory distress.     Breath sounds: Normal breath sounds. No stridor.  Abdominal:     General: Abdomen is flat. There is no distension.     Palpations: Abdomen is soft.     Tenderness: There is no abdominal tenderness.  Musculoskeletal:     Cervical back: No rigidity.       Back:     Right lower leg: No edema.     Left lower leg: No edema.     Comments: She has paraspinal tenderness thoracic and lumbar spine.  No midline TTP, no crepitance or step-off  Skin:    General: Skin is warm and dry.     Capillary Refill: Capillary  refill takes less than 2 seconds.  Neurological:     Mental Status: She is alert and oriented to person, place, and time.     GCS: GCS eye subscore is 4. GCS verbal subscore is 5. GCS motor subscore is 6.      Cranial Nerves: Cranial nerves 2-12 are intact.     Sensory: Sensation is intact.     Motor: Motor function is intact.     Coordination: Coordination is intact.     Comments: Gait testing deferred secondary to patient safety.  Strength 5/5 to BLUE/BLLE, equal and symmetric    Psychiatric:        Mood and Affect: Mood normal.        Behavior: Behavior normal. Behavior is cooperative.     ED Results and Treatments Labs (all labs ordered are listed, but only abnormal results are displayed) Labs Reviewed  COMPREHENSIVE METABOLIC PANEL WITH GFR - Abnormal; Notable for the following components:      Result Value   Sodium 132 (*)    Chloride 96 (*)    Alkaline Phosphatase 34 (*)    All other components within normal limits  URINALYSIS, ROUTINE W REFLEX MICROSCOPIC - Abnormal; Notable for the following components:   Color, Urine STRAW (*)    Specific Gravity, Urine 1.004 (*)    All other components within normal limits  CBC - Abnormal; Notable for the following components:   RBC 3.44 (*)    Hemoglobin 10.4 (*)    HCT 31.9 (*)    All other components within normal limits                                                                                                                          Radiology DG Chest Portable 1 View Result Date: 08/06/2023 CLINICAL DATA:  Acute back pain. EXAM: PORTABLE CHEST 1 VIEW COMPARISON:  June 08, 2022. FINDINGS: The heart size and mediastinal contours are within normal limits. Both lungs are clear. The visualized skeletal structures are unremarkable. IMPRESSION: No active disease. Electronically Signed   By: Rosalene Colon M.D.   On: 08/06/2023 17:57   CT Thoracic Spine Wo Contrast Result Date: 08/06/2023 CLINICAL DATA:  Back pain. EXAM: CT THORACIC AND LUMBAR SPINE WITHOUT CONTRAST TECHNIQUE: Multidetector CT imaging of the thoracic and lumbar spine was performed without contrast. Multiplanar CT image reconstructions were also generated. RADIATION DOSE  REDUCTION: This exam was performed according to the departmental dose-optimization program which includes automated exposure control, adjustment of the mA and/or kV according to patient size and/or use of iterative reconstruction technique. COMPARISON:  CT abdomen pelvis dated 05/08/2019. FINDINGS: CT THORACIC SPINE FINDINGS Alignment: No acute subluxation. Vertebrae: No acute fracture. Osteopenia. No suspicious bone lesions. Paraspinal and other soft tissues: Negative. Disc levels: No acute findings. There is degenerative changes and disc space narrowing. The neural foramina appear patent. CT LUMBAR SPINE FINDINGS Segmentation: 5 lumbar type vertebrae. Alignment: No acute subluxation.  Grade 1  L1-L2 retrolisthesis. Vertebrae: No acute fracture or suspicious bone lesions. Osteopenia. Paraspinal and other soft tissues: Negative. Disc levels: Degenerative changes with multilevel disc desiccation and vacuum phenomena most prominent at L5-S1. IMPRESSION: 1. No acute/traumatic thoracic or lumbar spine pathology. 2. Degenerative changes. Electronically Signed   By: Angus Bark M.D.   On: 08/06/2023 17:38   CT L-SPINE NO CHARGE Result Date: 08/06/2023 CLINICAL DATA:  Back pain. EXAM: CT THORACIC AND LUMBAR SPINE WITHOUT CONTRAST TECHNIQUE: Multidetector CT imaging of the thoracic and lumbar spine was performed without contrast. Multiplanar CT image reconstructions were also generated. RADIATION DOSE REDUCTION: This exam was performed according to the departmental dose-optimization program which includes automated exposure control, adjustment of the mA and/or kV according to patient size and/or use of iterative reconstruction technique. COMPARISON:  CT abdomen pelvis dated 05/08/2019. FINDINGS: CT THORACIC SPINE FINDINGS Alignment: No acute subluxation. Vertebrae: No acute fracture. Osteopenia. No suspicious bone lesions. Paraspinal and other soft tissues: Negative. Disc levels: No acute findings. There is  degenerative changes and disc space narrowing. The neural foramina appear patent. CT LUMBAR SPINE FINDINGS Segmentation: 5 lumbar type vertebrae. Alignment: No acute subluxation.  Grade 1 L1-L2 retrolisthesis. Vertebrae: No acute fracture or suspicious bone lesions. Osteopenia. Paraspinal and other soft tissues: Negative. Disc levels: Degenerative changes with multilevel disc desiccation and vacuum phenomena most prominent at L5-S1. IMPRESSION: 1. No acute/traumatic thoracic or lumbar spine pathology. 2. Degenerative changes. Electronically Signed   By: Angus Bark M.D.   On: 08/06/2023 17:38   CT ABDOMEN PELVIS W CONTRAST Result Date: 08/06/2023 CLINICAL DATA:  Abdominal pain/back pain. EXAM: CT ABDOMEN AND PELVIS WITH CONTRAST TECHNIQUE: Multidetector CT imaging of the abdomen and pelvis was performed using the standard protocol following bolus administration of intravenous contrast. RADIATION DOSE REDUCTION: This exam was performed according to the departmental dose-optimization program which includes automated exposure control, adjustment of the mA and/or kV according to patient size and/or use of iterative reconstruction technique. CONTRAST:  OMNIPAQUE  IOHEXOL  300 MG/ML  SOLN COMPARISON:  CT abdomen pelvis dated 05/08/2019. FINDINGS: Lower chest: The visualized lung bases are clear. No intra-abdominal free air or free fluid. Hepatobiliary: The liver is unremarkable. There is mild dilatation, post cholecystectomy. Pancreas: Unremarkable. No pancreatic ductal dilatation or surrounding inflammatory changes. Spleen: Normal in size without focal abnormality. Adrenals/Urinary Tract: The adrenal glands unremarkable. The kidneys, visualized ureters, and urinary bladder appear unremarkable. Stomach/Bowel: There is no bowel obstruction or active inflammation. The appendix is normal. Vascular/Lymphatic: Mild aortoiliac atherosclerotic disease. The IVC is unremarkable. No portal venous gas. There is no  adenopathy. Reproductive: Hysterectomy.  No suspicious adnexal masses. Other: None Musculoskeletal: Osteopenia with degenerative changes of the spine. No acute osseous pathology. IMPRESSION: 1. No acute intra-abdominal or pelvic pathology. 2.  Aortic Atherosclerosis (ICD10-I70.0). Electronically Signed   By: Angus Bark M.D.   On: 08/06/2023 17:32    Pertinent labs & imaging results that were available during my care of the patient were reviewed by me and considered in my medical decision making (see MDM for details).  Medications Ordered in ED Medications  alum & mag hydroxide-simeth (MAALOX/MYLANTA) 200-200-20 MG/5ML suspension 15 mL (15 mLs Oral Given 08/06/23 1611)  pantoprazole  (PROTONIX ) injection 40 mg (40 mg Intravenous Given 08/06/23 1611)  iohexol  (OMNIPAQUE ) 300 MG/ML solution 100 mL (100 mLs Intravenous Contrast Given 08/06/23 1708)  Procedures Procedures  (including critical care time)  Medical Decision Making / ED Course    Medical Decision Making:    Abigal A Mcenroe is a 73 y.o. female with past medical history as below, significant for CAD, hypertension, PVD who presents to the ED with complaint of abdominal pain, back pain, thoracic pain.. The complaint involves an extensive differential diagnosis and also carries with it a high risk of complications and morbidity.  Serious etiology was considered. Ddx includes but is not limited to: Differential diagnosis includes but is not exclusive to acute cholecystitis, intrathoracic causes for epigastric abdominal pain, gastritis, duodenitis, pancreatitis, small bowel or large bowel obstruction, abdominal aortic aneurysm, hernia, gastritis, etc. Differential diagnosis includes but is not exclusive to musculoskeletal back pain, renal colic, urinary tract infection, pyelonephritis, intra-abdominal causes of  back pain, aortic aneurysm or dissection, cauda equina syndrome, sciatica, lumbar disc disease, thoracic disc disease, etc.   Complete initial physical exam performed, notably the patient was in no distress, resting comfortably on stretcher, family bedside.    Reviewed and confirmed nursing documentation for past medical history, family history, social history.  Vital signs reviewed.     Brief summary:  73 year old female history above here with back pain, upper and lower and abdominal pain.  Exam is reassuring, no neurodeficits.  Abdomen nonperitoneal   Clinical Course as of 08/07/23 0704  Thu Aug 06, 2023  1837 No acute traumatic findings or findings that explain patient's discomfort on CT abdomen pelvis T-spine L-spine.  She is feeling better after Maalox and Protonix .  No significant laboratory abnormalities.  Able to ambulate here with her rolling walker.  Stable for discharge at this time with PCP follow-up.  She has an upcoming PCP appointment in 2 weeks [MP]    Clinical Course User Index [MP] Sallyanne Creamer, DO     Labs reviewed, these are stable.  Patient was given GI cocktail.  Handoff to incoming EDP pending imaging and reassessment.             Additional history obtained: -Additional history obtained from family -External records from outside source obtained and reviewed including: Chart review including previous notes, labs, imaging, consultation notes including  Home meds Prior ed visits   Lab Tests: -I ordered, reviewed, and interpreted labs.   The pertinent results include:   Labs Reviewed  COMPREHENSIVE METABOLIC PANEL WITH GFR - Abnormal; Notable for the following components:      Result Value   Sodium 132 (*)    Chloride 96 (*)    Alkaline Phosphatase 34 (*)    All other components within normal limits  URINALYSIS, ROUTINE W REFLEX MICROSCOPIC - Abnormal; Notable for the following components:   Color, Urine STRAW (*)    Specific Gravity,  Urine 1.004 (*)    All other components within normal limits  CBC - Abnormal; Notable for the following components:   RBC 3.44 (*)    Hemoglobin 10.4 (*)    HCT 31.9 (*)    All other components within normal limits    Notable for labs stable  EKG   EKG Interpretation Date/Time:    Ventricular Rate:    PR Interval:    QRS Duration:    QT Interval:    QTC Calculation:   R Axis:      Text Interpretation:           Imaging Studies ordered: I ordered imaging studies including CT abd/ lumbar/thoracic > PENDING    Medicines ordered and  prescription drug management: Meds ordered this encounter  Medications   alum & mag hydroxide-simeth (MAALOX/MYLANTA) 200-200-20 MG/5ML suspension 15 mL   pantoprazole  (PROTONIX ) injection 40 mg   iohexol  (OMNIPAQUE ) 300 MG/ML solution 100 mL    -I have reviewed the patients home medicines and have made adjustments as needed   Consultations Obtained: na   Cardiac Monitoring: Continuous pulse oximetry interpreted by myself, 100% on RA.    Social Determinants of Health:  Diagnosis or treatment significantly limited by social determinants of health: current smoker   Reevaluation: After the interventions noted above, I reevaluated the patient and found that they have improved  Co morbidities that complicate the patient evaluation  Past Medical History:  Diagnosis Date   Arthritis    Cancer (HCC)    uterine   Coronary artery disease    Heart murmur    had forever- not a problem   Hypertension    Irregular heart beat    Kidney stones    Peripheral vascular disease (HCC)    Varicose veins       Dispostion: Disposition decision including need for hospitalization was considered, and patient disposition pending at time of sign out.    Final Clinical Impression(s) / ED Diagnoses Final diagnoses:  Acute low back pain without sciatica, unspecified back pain laterality        Teddi Favors, DO 08/09/23 7829

## 2023-09-10 ENCOUNTER — Other Ambulatory Visit: Payer: Self-pay | Admitting: Internal Medicine

## 2023-09-10 DIAGNOSIS — Z1231 Encounter for screening mammogram for malignant neoplasm of breast: Secondary | ICD-10-CM

## 2023-09-17 ENCOUNTER — Ambulatory Visit

## 2023-09-21 ENCOUNTER — Ambulatory Visit
Admission: RE | Admit: 2023-09-21 | Discharge: 2023-09-21 | Disposition: A | Discharge status: Home / Self Care | Source: Ambulatory Visit | Attending: Internal Medicine | Admitting: Internal Medicine

## 2023-09-21 DIAGNOSIS — Z1231 Encounter for screening mammogram for malignant neoplasm of breast: Secondary | ICD-10-CM

## 2024-04-01 ENCOUNTER — Other Ambulatory Visit: Payer: Self-pay

## 2024-04-01 ENCOUNTER — Emergency Department (HOSPITAL_COMMUNITY)

## 2024-04-01 ENCOUNTER — Encounter (HOSPITAL_COMMUNITY): Payer: Self-pay

## 2024-04-01 ENCOUNTER — Emergency Department (HOSPITAL_COMMUNITY)
Admission: EM | Admit: 2024-04-01 | Discharge: 2024-04-02 | Discharge status: Against Medical Advice | Attending: Emergency Medicine | Admitting: Emergency Medicine

## 2024-04-01 DIAGNOSIS — M7989 Other specified soft tissue disorders: Secondary | ICD-10-CM

## 2024-04-01 DIAGNOSIS — Z5321 Procedure and treatment not carried out due to patient leaving prior to being seen by health care provider: Secondary | ICD-10-CM | POA: Insufficient documentation

## 2024-04-01 LAB — CBC WITH DIFFERENTIAL/PLATELET
Abs Immature Granulocytes: 0.02 K/uL (ref 0.00–0.07)
Basophils Absolute: 0 K/uL (ref 0.0–0.1)
Basophils Relative: 1 %
Eosinophils Absolute: 0.1 K/uL (ref 0.0–0.5)
Eosinophils Relative: 2 %
HCT: 30.7 % — ABNORMAL LOW (ref 36.0–46.0)
Hemoglobin: 10.2 g/dL — ABNORMAL LOW (ref 12.0–15.0)
Immature Granulocytes: 0 %
Lymphocytes Relative: 14 %
Lymphs Abs: 0.8 K/uL (ref 0.7–4.0)
MCH: 30.5 pg (ref 26.0–34.0)
MCHC: 33.2 g/dL (ref 30.0–36.0)
MCV: 91.9 fL (ref 80.0–100.0)
Monocytes Absolute: 0.4 K/uL (ref 0.1–1.0)
Monocytes Relative: 7 %
Neutro Abs: 4.3 K/uL (ref 1.7–7.7)
Neutrophils Relative %: 76 %
Platelets: 216 K/uL (ref 150–400)
RBC: 3.34 MIL/uL — ABNORMAL LOW (ref 3.87–5.11)
RDW: 12.6 % (ref 11.5–15.5)
WBC: 5.7 K/uL (ref 4.0–10.5)
nRBC: 0 % (ref 0.0–0.2)

## 2024-04-01 LAB — BASIC METABOLIC PANEL WITH GFR
Anion gap: 10 (ref 5–15)
BUN: 27 mg/dL — ABNORMAL HIGH (ref 8–23)
CO2: 28 mmol/L (ref 22–32)
Calcium: 10.4 mg/dL — ABNORMAL HIGH (ref 8.9–10.3)
Chloride: 96 mmol/L — ABNORMAL LOW (ref 98–111)
Creatinine, Ser: 1.21 mg/dL — ABNORMAL HIGH (ref 0.44–1.00)
GFR, Estimated: 47 mL/min — ABNORMAL LOW
Glucose, Bld: 89 mg/dL (ref 70–99)
Potassium: 5.5 mmol/L — ABNORMAL HIGH (ref 3.5–5.1)
Sodium: 133 mmol/L — ABNORMAL LOW (ref 135–145)

## 2024-04-01 NOTE — Progress Notes (Signed)
 Bilateral lower extremity venous duplex has been completed.  Results can be found in chart review under CV Proc. and relayed to PA in triage.  04/01/2024 5:39 PM  Jere Bostrom Elden Appl, RVT.

## 2024-04-01 NOTE — ED Triage Notes (Signed)
 Patient has leg swelling bilaterally, R leg is worse than the left and has warmth and redness. Patient reports compliance with her fluid pill as well.

## 2024-04-01 NOTE — ED Provider Triage Note (Signed)
 Emergency Medicine Provider Triage Evaluation Note  Haley Long , a 74 y.o. female  was evaluated in triage.  Pt complains of bilateral (R>L) leg swelling onset after steroid shot in her hip, unsure if related. Compliant with her dieretics. Redness to the right anterior lower leg. No hx of DVT, not anticoagulated.   Review of Systems  Positive:  Negative:   Physical Exam  BP (!) 155/66 (BP Location: Right Arm)   Pulse 71   Temp 98.4 F (36.9 C)   Resp 18   SpO2 99%  Gen:   Awake, no distress   Resp:  Normal effort  MSK:   Moves extremities without difficulty  Other:  Edema to bilateral lower extremities, right more so than left. Redness with warmth to anterior right lower leg.   Medical Decision Making  Medically screening exam initiated at 2:41 PM.  Appropriate orders placed.  Arleta A Langhorst was informed that the remainder of the evaluation will be completed by another provider, this initial triage assessment does not replace that evaluation, and the importance of remaining in the ED until their evaluation is complete.     Beverley Leita DELENA, PA-C 04/01/24 1443

## 2024-04-01 NOTE — ED Notes (Signed)
 Pt is leaving.

## 2024-04-01 NOTE — ED Triage Notes (Signed)
 Patient states that both of her legs are swollen for the past 2 days. She states that her back is deteriorating and that she got a shot and the next day she woke up and her legs were swollen. She is also reporting nausea since this morning.    Denies CHF but does have a history of irregular heart rhythm.

## 2024-04-30 ENCOUNTER — Other Ambulatory Visit: Payer: Self-pay

## 2024-04-30 ENCOUNTER — Emergency Department (HOSPITAL_COMMUNITY)

## 2024-04-30 ENCOUNTER — Emergency Department (HOSPITAL_COMMUNITY)
Admission: EM | Admit: 2024-04-30 | Discharge: 2024-05-01 | Disposition: A | Attending: Emergency Medicine | Admitting: Emergency Medicine

## 2024-04-30 ENCOUNTER — Encounter (HOSPITAL_COMMUNITY): Payer: Self-pay | Admitting: Emergency Medicine

## 2024-04-30 DIAGNOSIS — R609 Edema, unspecified: Secondary | ICD-10-CM

## 2024-04-30 DIAGNOSIS — I10 Essential (primary) hypertension: Secondary | ICD-10-CM | POA: Diagnosis not present

## 2024-04-30 DIAGNOSIS — R600 Localized edema: Secondary | ICD-10-CM | POA: Diagnosis present

## 2024-04-30 DIAGNOSIS — I2510 Atherosclerotic heart disease of native coronary artery without angina pectoris: Secondary | ICD-10-CM | POA: Diagnosis not present

## 2024-04-30 LAB — CBC WITH DIFFERENTIAL/PLATELET
Abs Immature Granulocytes: 0.03 10*3/uL (ref 0.00–0.07)
Basophils Absolute: 0.1 10*3/uL (ref 0.0–0.1)
Basophils Relative: 1 %
Eosinophils Absolute: 0.1 10*3/uL (ref 0.0–0.5)
Eosinophils Relative: 2 %
HCT: 29 % — ABNORMAL LOW (ref 36.0–46.0)
Hemoglobin: 9.8 g/dL — ABNORMAL LOW (ref 12.0–15.0)
Immature Granulocytes: 1 %
Lymphocytes Relative: 17 %
Lymphs Abs: 0.8 10*3/uL (ref 0.7–4.0)
MCH: 30.4 pg (ref 26.0–34.0)
MCHC: 33.8 g/dL (ref 30.0–36.0)
MCV: 90.1 fL (ref 80.0–100.0)
Monocytes Absolute: 0.4 10*3/uL (ref 0.1–1.0)
Monocytes Relative: 7 %
Neutro Abs: 3.5 10*3/uL (ref 1.7–7.7)
Neutrophils Relative %: 72 %
Platelets: 199 10*3/uL (ref 150–400)
RBC: 3.22 MIL/uL — ABNORMAL LOW (ref 3.87–5.11)
RDW: 12.8 % (ref 11.5–15.5)
WBC: 4.9 10*3/uL (ref 4.0–10.5)
nRBC: 0 % (ref 0.0–0.2)

## 2024-04-30 LAB — COMPREHENSIVE METABOLIC PANEL WITH GFR
ALT: 25 U/L (ref 0–44)
AST: 32 U/L (ref 15–41)
Albumin: 4.4 g/dL (ref 3.5–5.0)
Alkaline Phosphatase: 40 U/L (ref 38–126)
Anion gap: 10 (ref 5–15)
BUN: 18 mg/dL (ref 8–23)
CO2: 27 mmol/L (ref 22–32)
Calcium: 9.1 mg/dL (ref 8.9–10.3)
Chloride: 98 mmol/L (ref 98–111)
Creatinine, Ser: 1.2 mg/dL — ABNORMAL HIGH (ref 0.44–1.00)
GFR, Estimated: 47 mL/min — ABNORMAL LOW
Glucose, Bld: 97 mg/dL (ref 70–99)
Potassium: 3.6 mmol/L (ref 3.5–5.1)
Sodium: 134 mmol/L — ABNORMAL LOW (ref 135–145)
Total Bilirubin: 0.3 mg/dL (ref 0.0–1.2)
Total Protein: 6.9 g/dL (ref 6.5–8.1)

## 2024-04-30 LAB — I-STAT CG4 LACTIC ACID, ED: Lactic Acid, Venous: 1 mmol/L (ref 0.5–1.9)

## 2024-04-30 LAB — PRO BRAIN NATRIURETIC PEPTIDE: Pro Brain Natriuretic Peptide: 157 pg/mL

## 2024-04-30 MED ORDER — ACETAMINOPHEN 500 MG PO TABS
1000.0000 mg | ORAL_TABLET | Freq: Once | ORAL | Status: AC
  Administered 2024-04-30: 1000 mg via ORAL
  Filled 2024-04-30: qty 2

## 2024-04-30 NOTE — Progress Notes (Signed)
 VASCULAR LAB    Bilateral lower extremity venous duplex has been performed.  See CV proc for preliminary results.  Gave verbal report to Lonni Camp, PA-C  Celine Dishman, RVT 04/30/2024, 5:14 PM

## 2024-04-30 NOTE — ED Triage Notes (Signed)
 Pt from home via [facility]. Pt reports bilateral leg swelling and redness x 2 days. Pt also states that she has abdominal pain. Pt also reporting she has blood clots on each side of her neck.

## 2024-04-30 NOTE — Discharge Instructions (Signed)
 You have been evaluated for your leg swelling.  Fortunately no evidence of any blood clot in your legs or any signs of heart failure.  You would likely benefit from taking Lasix  that was recently prescribed as well as antibiotic that was recently prescribed by your doctor to help with your symptoms.  Keep your legs elevated at rest, use TED hose to help with swelling as well.

## 2024-04-30 NOTE — ED Notes (Signed)
 Macario Rinks (friend) [phone] would like to speak to pt. When you get a chance. Thank you

## 2024-04-30 NOTE — ED Notes (Signed)
"  Vascular at bedside.   "

## 2024-05-01 NOTE — ED Notes (Signed)
 Spoke with Macario, friend over the phone, friend states that she was unable to pick patient up due to condition of roads. Friend states that she will call property manager in AM.  While talking to the patient she explained the living situation. She lives at [facility] where she has to have a key card to gain access, in which she did not bring upon arrival to ed, so she is unable to get into apartment.

## 2024-05-01 NOTE — ED Notes (Signed)
 Attempted calling pt's friend Macario twice who eventually called this RN back. Stated she will come to get the pt in a little bit.
